# Patient Record
Sex: Male | Born: 1955 | Race: Black or African American | Hispanic: No | Marital: Married | Smoking: Former smoker
Health system: Southern US, Community
[De-identification: ages and names within clinical notes are randomized; demographics above are authoritative.]

## PROBLEM LIST (undated history)

## (undated) DIAGNOSIS — J309 Allergic rhinitis, unspecified: Secondary | ICD-10-CM

## (undated) DIAGNOSIS — E785 Hyperlipidemia, unspecified: Secondary | ICD-10-CM

## (undated) DIAGNOSIS — I10 Essential (primary) hypertension: Secondary | ICD-10-CM

## (undated) DIAGNOSIS — E119 Type 2 diabetes mellitus without complications: Secondary | ICD-10-CM

## (undated) HISTORY — DX: Hyperlipidemia, unspecified: E78.5

## (undated) HISTORY — DX: Morbid (severe) obesity due to excess calories: E66.01

## (undated) HISTORY — PX: NO PAST SURGERIES: SHX2092

## (undated) HISTORY — DX: Allergic rhinitis, unspecified: J30.9

## (undated) HISTORY — DX: Essential (primary) hypertension: I10

## (undated) HISTORY — DX: Type 2 diabetes mellitus without complications: E11.9

---

## 1998-04-12 ENCOUNTER — Emergency Department (HOSPITAL_COMMUNITY): Admission: EM | Admit: 1998-04-12 | Discharge: 1998-04-12 | Payer: Self-pay | Admitting: Emergency Medicine

## 2010-03-02 ENCOUNTER — Emergency Department (HOSPITAL_COMMUNITY): Admission: EM | Admit: 2010-03-02 | Discharge: 2010-03-02 | Payer: Self-pay | Admitting: Emergency Medicine

## 2010-03-02 LAB — CONVERTED CEMR LAB
BUN: 13 mg/dL
CO2: 29 meq/L
Glucose, Bld: 166 mg/dL
Platelets: 230 10*3/uL

## 2010-03-14 ENCOUNTER — Ambulatory Visit: Payer: Self-pay | Admitting: Internal Medicine

## 2010-03-14 DIAGNOSIS — I10 Essential (primary) hypertension: Secondary | ICD-10-CM

## 2010-03-14 DIAGNOSIS — J309 Allergic rhinitis, unspecified: Secondary | ICD-10-CM | POA: Insufficient documentation

## 2010-03-14 DIAGNOSIS — R519 Headache, unspecified: Secondary | ICD-10-CM | POA: Insufficient documentation

## 2010-03-14 DIAGNOSIS — R51 Headache: Secondary | ICD-10-CM

## 2010-04-11 ENCOUNTER — Ambulatory Visit: Payer: Self-pay | Admitting: Internal Medicine

## 2010-04-11 DIAGNOSIS — E119 Type 2 diabetes mellitus without complications: Secondary | ICD-10-CM

## 2010-04-11 DIAGNOSIS — IMO0002 Reserved for concepts with insufficient information to code with codable children: Secondary | ICD-10-CM | POA: Insufficient documentation

## 2010-04-11 LAB — CONVERTED CEMR LAB
CO2: 28 meq/L (ref 19–32)
Cholesterol: 243 mg/dL — ABNORMAL HIGH (ref 0–200)
Creatinine, Ser: 1 mg/dL (ref 0.4–1.5)
HDL: 44.7 mg/dL (ref >39.00)
Hgb A1c MFr Bld: 6.9 % — ABNORMAL HIGH (ref 4.6–6.5)
Sodium: 135 meq/L (ref 135–145)
TSH: 1.66 microintl units/mL (ref 0.35–5.50)
Total CHOL/HDL Ratio: 5
Triglycerides: 159 mg/dL — ABNORMAL HIGH (ref 0.0–149.0)
VLDL: 31.8 mg/dL (ref 0.0–40.0)

## 2010-05-02 ENCOUNTER — Telehealth: Payer: Self-pay | Admitting: Internal Medicine

## 2010-05-10 ENCOUNTER — Telehealth: Payer: Self-pay | Admitting: Internal Medicine

## 2010-05-10 ENCOUNTER — Encounter: Payer: Self-pay | Admitting: Internal Medicine

## 2010-05-24 ENCOUNTER — Ambulatory Visit: Payer: Self-pay | Admitting: Internal Medicine

## 2010-05-24 DIAGNOSIS — E782 Mixed hyperlipidemia: Secondary | ICD-10-CM

## 2010-05-30 ENCOUNTER — Telehealth: Payer: Self-pay | Admitting: Internal Medicine

## 2010-07-12 ENCOUNTER — Ambulatory Visit: Payer: Self-pay | Admitting: Internal Medicine

## 2010-08-16 ENCOUNTER — Ambulatory Visit: Payer: Self-pay | Admitting: Internal Medicine

## 2010-09-06 ENCOUNTER — Ambulatory Visit: Payer: Self-pay | Admitting: Internal Medicine

## 2010-10-04 ENCOUNTER — Ambulatory Visit
Admission: RE | Admit: 2010-10-04 | Discharge: 2010-10-04 | Payer: Self-pay | Source: Home / Self Care | Attending: Internal Medicine | Admitting: Internal Medicine

## 2010-10-18 NOTE — Assessment & Plan Note (Signed)
Summary: 6 WK ROV /NWS  #   Vital Signs:  Patient profile:   55 year old male Height:      70 inches (177.80 cm) Weight:      306 pounds (139.09 kg) O2 Sat:      97 % on Room air Temp:     98.6 degrees F (37.00 degrees C) oral Pulse rate:   68 / minute BP sitting:   158 / 84  (left arm) Cuff size:   large  Vitals Entered By: Orlan Leavens RMA (May 24, 2010 1:29 PM)  O2 Flow:  Room air CC: 6 week follow-up Is Patient Diabetic? Yes Did you bring your meter with you today? No Pain Assessment Patient in pain? no      Comments Pt states he has'nt started Lipitor yet   Primary Care Provider:  Newt Lukes MD  CC:  6 week follow-up.  History of Present Illness: here for f/u -  1) HTN - new dx 03/02/10 - begun on hctz for same during ER visit for HA and added ACEI 02/2010 - then added amlodipine end 03/2010- reports 100% compliance with ongoing medical treatment and no changes in medication dose or frequency. denies adverse side effects related to current therapy. mild chronic edema ankles, no CP or other HA - no vision changes  2) obesity - hx insulin resistance "but not DM" per pt - denies weight gain but c/o difficulty losing weight despite physcial activty and diet changes - congrats on 4# down since last OV, 9# since est care  3) HA - seen in ER 6/15 for same - symptoms improved since starting meds for BP control - no HA today - no weakness or vison changes - symptoms last <3 hours per episode, no aura or migraine hx - occ use tylenol for same  4) seasonal allg - occ use OTC meds for lfares - no current or active symptoms - no sneezing, runny nose or sinus pressure - no nasal dc or cough   5) dyslipidemia - has not begun on meds rx 03/2010 due to concerns over poss SE - comitted to  low fat diet and cont weight loss  Current Medications (verified): 1)  Lisinopril-Hydrochlorothiazide 20-25 Mg Tabs (Lisinopril-Hydrochlorothiazide) .Marland Kitchen.. 1 By Mouth Once Daily 2)   Metformin Hcl 500 Mg Xr24h-Tab (Metformin Hcl) .Marland Kitchen.. 1 By Mouth Every Morning 3)  Amlodipine Besylate 5 Mg Tabs (Amlodipine Besylate) .Marland Kitchen.. 1 By Mouth Once Daily 4)  Lipitor 20 Mg Tabs (Atorvastatin Calcium) .Marland Kitchen.. 1 By Mouth At Bedtime  Allergies (verified): 1)  ! Penicillin  Past History:  Past Medical History: Allergic rhinitis Hypertension obesity with insulin resistance Hyperlipidemia  Review of Systems  The patient denies fever, chest pain, headaches, and abdominal pain.    Physical Exam  General:  obese, alert, well-developed, well-nourished, and cooperative to examination.    Lungs:  normal respiratory effort, no intercostal retractions or use of accessory muscles; normal breath sounds bilaterally - no crackles and no wheezes.    Heart:  normal rate, regular rhythm, no murmur, and no rub. BLE with trace pedal edema.  Psych:  reserved, quiet - Oriented X3, memory intact for recent and remote, normally interactive, good eye contact, not anxious appearing, not depressed appearing, and not agitated.      Impression & Recommendations:  Problem # 1:  HYPERTENSION (ICD-401.9)  His updated medication list for this problem includes:    Lisinopril-hydrochlorothiazide 20-25 Mg Tabs (Lisinopril-hydrochlorothiazide) .Marland Kitchen... 1 by mouth  once daily    Amlodipine Besylate 10 Mg Tabs (Amlodipine besylate) .Marland Kitchen... 1 by mouth once daily  improving but still uncontrolled -  titrate amlodipine now - new erx done   BP today: 158/84 Prior BP: 188/92 (04/11/2010) Prior BP: 190/101 (03/14/2010)  Labs Reviewed: K+: 4.3 (04/11/2010) Creat: : 1.0 (04/11/2010)   Chol: 243 (04/11/2010)   HDL: 44.70 (04/11/2010)   TG: 159.0 (04/11/2010)  Orders: Prescription Created Electronically [id number])  Problem # 2:  HYPERLIPIDEMIA (ICD-272.4) does not wish to start med tx for same -  educated on need to control ASD risk factors given other comorbidities cont to work on diet and weioght loss - recheck late  09/2010 - revisit med rx if still uncontrolled The following medications were removed from the medication list:    Lipitor 20 Mg Tabs (Atorvastatin calcium) .Marland Kitchen... 1 by mouth at bedtime    HDL:44.70 (04/11/2010)  Chol:243 (04/11/2010)  Trig:159.0 (04/11/2010)  Problem # 3:  OBESITY, MORBID (ICD-278.01) congrats on cont weight loss - encouragement and education provided  Ht: 70 (03/14/2010)   Wt: 315.8 (03/14/2010)   BMI: 45.48 (03/14/2010)  Ht: 70 (04/11/2010)   Wt: 310.12 (04/11/2010)   BMI: 45.48 (03/14/2010)  Ht: 70 (05/24/2010)   Wt: 306 (05/24/2010)   BMI: 45.48 (03/14/2010)  Problem # 4:  DIABETES MELLITUS, TYPE II (ICD-250.00) insulin resistance - cont metformin tx and recheck a1c q 61mo His updated medication list for this problem includes:    Lisinopril-hydrochlorothiazide 20-25 Mg Tabs (Lisinopril-hydrochlorothiazide) .Marland Kitchen... 1 by mouth once daily    Metformin Hcl 500 Mg Xr24h-tab (Metformin hcl) .Marland Kitchen... 1 by mouth every morning  Labs Reviewed: Creat: 1.0 (04/11/2010)    Reviewed HgBA1c results: 6.9 (04/11/2010)  Complete Medication List: 1)  Lisinopril-hydrochlorothiazide 20-25 Mg Tabs (Lisinopril-hydrochlorothiazide) .Marland Kitchen.. 1 by mouth once daily 2)  Metformin Hcl 500 Mg Xr24h-tab (Metformin hcl) .Marland Kitchen.. 1 by mouth every morning 3)  Amlodipine Besylate 10 Mg Tabs (Amlodipine besylate) .Marland Kitchen.. 1 by mouth once daily  Patient Instructions: 1)  it was good to see you today. 2)  increase amlodipine dose and continue the combination blood pressure pill for your high BP readings- 3)  continue the metformin for the insulin resistance but hold on lipitor for now- 4)  your amlodipine prescription has been electronically submitted to your pharmacy. Please take as directed. Contact our office if you believe you're having problems with the medication(s).  5)  good job on the weight loss so far - keep it up! 6)  Please schedule a follow-up appointment in 6-8 weeks, sooner if problems.    Prescriptions: AMLODIPINE BESYLATE 10 MG TABS (AMLODIPINE BESYLATE) 1 by mouth once daily  #30 x 3   Entered and Authorized by:   Newt Lukes MD   Signed by:   Newt Lukes MD on 05/24/2010   Method used:   [id number]       [address]       [place], [place]  [address]       Ph: [phone] or [phone]       Fax: [phone]   RxID:   [id number]

## 2010-10-18 NOTE — Progress Notes (Signed)
Summary: lisinopril  Phone Note Refill Request Message from:  Fax from Pharmacy on May 02, 2010 10:18 AM  Refills Requested: Medication #1:  LISINOPRIL-HYDROCHLOROTHIAZIDE 20-25 MG TABS 1 by mouth once daily Pt is requesting 90 day supply on lisinopril   Method Requested: Electronic Initial call taken by: Orlan Leavens RMA,  May 02, 2010 10:19 AM    Prescriptions: LISINOPRIL-HYDROCHLOROTHIAZIDE 20-25 MG TABS (LISINOPRIL-HYDROCHLOROTHIAZIDE) 1 by mouth once daily  #90 x 0   Entered by:   Orlan Leavens RMA   Authorized by:   Newt Lukes MD   Signed by:   Orlan Leavens RMA on 05/02/2010   Method used:   [facility]        [facility] [id number]       [address]       [place], [place]  [address]       Ph: [phone] or [phone]       Fax: [phone]   RxID:   [id number]   Appended Document: lisinopril & metformin    Clinical Lists Changes  Medications: Rx of METFORMIN HCL 500 MG XR24H-TAB (METFORMIN HCL) 1 by mouth every morning;  #90 x 0;  Signed;  Entered by: Orlan Leavens RMA;  Authorized by: Newt Lukes MD;  Method used: [id number] [address], [place], [place]  [address], Ph: [phone] or [phone], Fax: [phone]    Prescriptions: METFORMIN HCL 500 MG XR24H-TAB (METFORMIN HCL) 1 by mouth every morning  #90 x 0   Entered by:   Orlan Leavens RMA   Authorized by:   Newt Lukes MD   Signed by:   Orlan Leavens RMA on 05/02/2010   Method used:   [facility]        [facility] [id number]       [address]       [place], [place]  [address]       Ph: [phone] or [phone]       Fax: [phone]   RxID:   [id number]

## 2010-10-18 NOTE — Assessment & Plan Note (Signed)
Summary: 2- 4 wk f/u #/cd   Vital Signs:  Patient profile:   55 year old male Height:      70 inches (177.80 cm) Weight:      300.0 pounds (136.36 kg) O2 Sat:      97 % on Room air Temp:     97.6 degrees F (36.44 degrees C) oral Pulse rate:   70 / minute BP sitting:   150 / 88  (left arm) Cuff size:   large  Vitals Entered By: Orlan Leavens RMA (August 16, 2010 1:30 PM)  O2 Flow:  Room air CC: 4 week follow-up Is Patient Diabetic? Yes Did you bring your meter with you today? No Pain Assessment Patient in pain? no        Primary Care Jonnae Fonseca:  Newt Lukes MD  CC:  4 week follow-up.  History of Present Illness: here for f/u -  1) HTN - new dx 03/02/10 - begun on hctz for same during ER visit for HA and added ACEI 02/2010 - then added amlodipine end 03/2010, inc dose 05/2010- reports 100% compliance with ongoing medical treatment and no changes in medication dose or frequency. 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 mild chronic edema ankles, no CP or other HA - no vision changes - changed to combo tribenzor 07/12/10  2) obesity - hx insulin resistance "but not DM" per pt - denies weight gain but c/o difficulty losing weight despite physcial activty and diet changes - congrats on 1# down since last OV, 10# since est care  3) HA - seen in ER 03/02/10 for same - symptoms improved since starting meds for BP control - no HA today - no weakness or vison changes - symptoms last <3 hours per episode, no aura or migraine hx - occ use tylenol for same  4) seasonal allg - occ use OTC meds for lfares - no current or active symptoms - no sneezing, runny nose or sinus pressure - no nasal dc or cough   5) dyslipidemia - has not begun on meds rx 03/2010 due to concerns over poss SE - comitted to  low fat diet and cont weight loss  6) DM2 - started metformin low dose for same 04/2010 - not monitoring cbgs but reports  compliance with ongoing medical treatment and no changes in medication dose or frequency. denies adverse side effects related to current therapy.   Current Medications (verified): 1)  Lisinopril-Hydrochlorothiazide 20-25 Mg Tabs (Lisinopril-Hydrochlorothiazide) .... Stop For Now 2)  Metformin Hcl 500 Mg Xr24h-Tab (Metformin Hcl) .Marland Kitchen.. 1 By Mouth Every Morning 3)  Tribenzor 40-5-25 Mg Tabs (Olmesartan-Amlodipine-Hctz) .Marland Kitchen.. 1 By Mouth Once Daily  Allergies (verified): 1)  ! Penicillin  Past History:  Past Medical History: Allergic rhinitis Hypertension obesity with insulin resistance Hyperlipidemia - declines statin or other rx tx  Review of Systems  The patient denies fever, vision loss, chest pain, and syncope.    Physical Exam  General:  obese, alert, well-developed, well-nourished, and cooperative to examination.    Lungs:  normal respiratory effort, no intercostal retractions or use of accessory muscles; normal breath sounds bilaterally - no crackles and no wheezes.    Heart:  normal rate, regular rhythm, no murmur, and no rub. BLE with trace pedal edema.    Impression & Recommendations:  Problem # 1:  HYPERTENSION (ICD-401.9)  The following medications were removed from the medication list:    Lisinopril-hydrochlorothiazide 20-25  Mg Tabs (Lisinopril-hydrochlorothiazide) ..... Stop for now His updated medication list for this problem includes:    Tribenzor 40-5-25 Mg Tabs (Olmesartan-amlodipine-hctz) .Marland Kitchen... 1 by mouth once daily    Tekturna 300 Mg Tabs (Aliskiren fumarate) .Marland Kitchen... 1 by mouth once daily  still uncontrolled -  felt inc sexual dysfx on max dose amlodipine - resumed 5mg  dose 07/12/10 cont tribenzor for med conveince dosing and add tekturna - samples given recheck 2 weeks for BP recheck and Cr/K check  BP today: 150/88 Prior BP: 152/82 (07/12/2010) Prior BP: 158/84 (05/24/2010) Prior BP: 188/92 (04/11/2010) Prior BP: 190/101 (03/14/2010)  Labs  Reviewed: K+: 4.3 (04/11/2010) Creat: : 1.0 (04/11/2010)   Chol: 243 (04/11/2010)   HDL: 44.70 (04/11/2010)   TG: 159.0 (04/11/2010)  Orders: Prescription Created Electronically [id number])  Complete Medication List: 1)  Metformin Hcl 500 Mg Xr24h-tab (Metformin hcl) .Marland Kitchen.. 1 by mouth every morning 2)  Tribenzor 40-5-25 Mg Tabs (Olmesartan-amlodipine-hctz) .Marland Kitchen.. 1 by mouth once daily 3)  Tekturna 300 Mg Tabs (Aliskiren fumarate) .Marland Kitchen.. 1 by mouth once daily  Patient Instructions: 1)  it was good to see you today. 2)  continue Tribenzor 40/5/25daily 3)  also start tekturna 300mg  daily - 2 week samples given today 4)  also your prescription has been electronically submitted to your pharmacy. Please take as directed. Contact our office if you believe you're having problems with the medication(s).  5)  good job on the weight loss so far - keep it up! 6)  Please schedule a follow-up appointment in 2-3 weeks to recheck blood pressure and medications, sooner if problems.  7)  will plan to recheck cholesterol late 09/2009 and review need for Lipitor treatment -  Prescriptions: TEKTURNA 300 MG TABS (ALISKIREN FUMARATE) 1 by mouth once daily  #30 x 3   Entered and Authorized by:   Newt Lukes MD   Signed by:   Newt Lukes MD on 08/16/2010   Method used:   [facility]        [facility] [id number]       [address]       [place], [place]  [address]       Ph: [phone] or [phone]       Fax: [phone]   RxID:   [id number]    Orders Added: 1)  Est. Patient Level IV [[address]] 2)  Prescription Created Electronically [phone]

## 2010-10-18 NOTE — Progress Notes (Signed)
Summary: pharmacy fax - start lipid tx  Phone Note From Pharmacy   Summary of Call: fax note requesting lipid lowering therapy for this pt - FLP reviewed - will start lipitor 20mg  qhs now - new erx done -  Initial call taken by: Newt Lukes MD,  May 10, 2010 2:18 PM    New/Updated Medications: LIPITOR 20 MG TABS (ATORVASTATIN CALCIUM) 1 by mouth at bedtime Prescriptions: LIPITOR 20 MG TABS (ATORVASTATIN CALCIUM) 1 by mouth at bedtime  #30 x 3   Entered and Authorized by:   Newt Lukes MD   Signed by:   Newt Lukes MD on 05/10/2010   Method used:   [facility] to        [facility] [id number]       [address]       [place], [place]  [address]       Ph: [phone] or [phone]       Fax: [phone]   RxID:   [id number]

## 2010-10-18 NOTE — Assessment & Plan Note (Signed)
Summary: new [facility]bs pt--post hosp-disch'd-03/02/10/dx:hypertension-# pkg/...   Vital Signs:  Patient profile:   55 year old male Height:      70 inches (177.80 cm) Weight:      315.8 pounds (143.55 kg) BMI:     45.48 O2 Sat:      97 % on Room air Temp:     98.6 degrees F (37.00 degrees C) oral Pulse rate:   77 / minute BP sitting:   190 / 101  (left arm) Cuff size:   large  Vitals Entered By: Orlan Leavens (March 14, 2010 2:08 PM)  O2 Flow:  Room air CC: New patient/ ER f/u Hypertension Is Patient Diabetic? No Pain Assessment Patient in pain? no        Primary Care Provider:  Newt Lukes MD  CC:  New patient/ ER f/u Hypertension.  History of Present Illness: new pt to me and our practice, here to est care  1) HTN - new dx 6/15 - begun on meds for same during ER visit for HA- reports compliance with ongoing medical treatment and no changes in medication dose or frequency. denies adverse side effects related to current therapy. mild edema ankles, no CP or other HA - no vision changes  2) obesity - hx insulin resistance "but not DM" per pt - based [facility] - denies weight gain but c/o inablity to lose weight despite phsycial activty and diet changes  3) HA - seen in ER 6/15 for same - symptoms improved since starting meds for BP controll - no HA today - no weakness or vison changes - symptoms last <3 hours per episode, no aura or migraine hx - occ use tylenol for same  4) seasonal allg - occ use OTC meds for lfares - no current or active symptoms - no sneezing, runny nose or sinus pressure - no nasal dc or cough   Preventive Screening-Counseling & Management  Alcohol-Tobacco     Alcohol drinks/day: <1     Smoking Status: quit     Year Quit: 1985     Tobacco Counseling: not to resume use of tobacco products  Caffeine-Diet-Exercise     Diet Counseling: to improve diet; diet is suboptimal     Does Patient Exercise: yes     Exercise Counseling: to  improve exercise regimen     Depression Counseling: not indicated; screening negative for depression  Safety-Violence-Falls     Seat Belt Use: yes     Seat Belt Counseling: not indicated; patient wears seat belts     Helmet Use: n/a     Helmet Counseling: not applicable     Firearms in the Home: no firearms in the home     Firearm Counseling: not applicable     Violence Counseling: not indicated; no violence risk noted     Fall Risk Counseling: not indicated; no significant falls noted  Clinical Review Panels:  CBC   WBC:  8.6 (03/02/2010)   Hgb:  17.0 (03/02/2010)   Hct:  45.7 (03/02/2010)   Platelets:  230 (03/02/2010)  Complete Metabolic Panel   Glucose:  166 (03/02/2010)   Sodium:  139 (03/02/2010)   Potassium:  4.3 (03/02/2010)   Chloride:  105 (03/02/2010)   CO2:  29 (03/02/2010)   BUN:  13 (03/02/2010)   Creatinine:  0.8 (03/02/2010)   -  Date:  03/02/2010    BG Random: 166    BUN: 13    Creatinine: 0.8  Sodium: 139    Potassium: 4.3    Chloride: 105    CO2 Total: 29    WBC: 8.6    HGB: 17.0    HCT: 45.7    PLT: 230  Current Medications (verified): 1)  Lisinopril 5 Mg Tabs (Lisinopril) .... Take 1 By Mouth Once Daily 2)  Hydrochlorothiazide 25 Mg Tabs (Hydrochlorothiazide) .... Take 1 By Mouth Once Daily  Allergies (verified): 1)  ! 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 I have reviewed all other systems and they were negative.   Physical Exam  General:  obese, alert, well-developed, well-nourished, and cooperative to examination.    Eyes:  vision grossly intact; pupils equal, round and reactive to light.  conjunctiva and lids normal.    Ears:  normal pinnae bilaterally, without erythema, swelling, or tenderness to palpation. TMs clear, without effusion, or cerumen impaction. Hearing grossly normal bilaterally  Mouth:  teeth and gums in good repair; mucous membranes moist, without lesions or ulcers. oropharynx clear without exudate, no erythema.  Neck:  thick, supple, full ROM, no masses, no thyromegaly; no thyroid nodules or tenderness. no JVD or carotid bruits.   Lungs:  normal respiratory effort, no intercostal retractions or use of accessory muscles; normal breath sounds bilaterally - no crackles and no wheezes.    Heart:  normal rate, regular rhythm, no murmur, and no rub. BLE with trace pedal edema. normal DP pulses and normal cap refill in all 4 extremities    Abdomen:  soft, non-tender, normal bowel sounds, no distention; no masses and no appreciable hepatomegaly or splenomegaly.   Msk:  No gross deformity; no scoliosis noted of thoracic or lumbar spine.   Neurologic:  alert & oriented X3 and cranial nerves II-XII symetrically intact.  strength normal in all extremities, sensation intact to light touch, and gait normal. speech fluent without dysarthria or aphasia; follows commands with good comprehension.  Skin:  no rashes, vesicles, ulcers, or erythema. No nodules or irregularity to palpation.  Psych:  reserved, quiet - Oriented X3, memory intact for recent and remote, normally interactive, good eye contact, not anxious appearing, not depressed appearing, and not agitated.      Impression & Recommendations:  Problem # 1:  HYPERTENSION (ICD-401.9)  increase med tx and combine as combo pill for ease -    expect will need 3rd agent to control - advised pt of same request close f/u next 4-6 weeks ER records and labs 6/15 reviewed -HA symptoms improved His updated medication list for this problem includes:    Lisinopril-hydrochlorothiazide 20-25 Mg Tabs (Lisinopril-hydrochlorothiazide) .Marland Kitchen... 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)  suspect underlying DM - will send for records from [facility] start metformin tx for same - educated on need for diet and  weight control to optimize health -   Orders: Prescription Created Electronically [id number])  Problem # 3:  OBESITY, MORBID (ICD-278.01)  Ht: 70 (03/14/2010)   Wt: 315.8 (03/14/2010)   BMI: 45.48 (03/14/2010) Time spent with patient 45 minutes, more than 50% of this time was spent counseling patient on weight managment, recent ER visit and test results and explaination of meds for BP and metabolic syndrome  Problem # 4:  HEADACHE (ICD-784.0) Assessment: Improved er 6/15 note reviewed -  neuro exam benign  Problem # 5:  ALLERGIC RHINITIS (ICD-477.9)  Discussed use of allergy medications and environmental measures.   Complete Medication List: 1)  Lisinopril-hydrochlorothiazide 20-25 Mg Tabs (Lisinopril-hydrochlorothiazide) .Marland Kitchen.. 1 by mouth once daily 2)  Metformin Hcl 500 Mg Xr24h-tab (Metformin hcl) .Marland Kitchen.. 1 by mouth every morning  Patient Instructions: 1)  it was good to see you today. 2)  change to combination blood pressure pill - and maximize doses of each medication in this one pill 3)  also start metformin for the insulin resistance as discussed- 4)  your prescriptions have been electronically submitted to your pharmacy. Please take as directed. Contact our office if you believe you're having problems with the medication(s).  5)  will send for records  from [facility] to review - 6)  Please schedule a follow-up appointment in  4-6 weeks, sooner if problems.  Prescriptions: METFORMIN HCL 500 MG XR24H-TAB (METFORMIN HCL) 1 by mouth every morning  #30 x 3   Entered and Authorized by:   Newt Lukes MD   Signed by:   Newt Lukes MD on 03/14/2010   Method used:   [id number]       [address]       [place], [place]  [address]       Ph: [phone] or [phone]       Fax: [phone]   RxID:   [id number] LISINOPRIL-HYDROCHLOROTHIAZIDE 20-25 MG TABS (LISINOPRIL-HYDROCHLOROTHIAZIDE) 1 by mouth once daily  #30 x 3   Entered and Authorized by:   Newt Lukes MD   Signed by:   Newt Lukes MD on 03/14/2010   Method used:   [facility]        [id number]       [address]       [place], [place]  [address]       Ph: [phone] or [phone]       Fax: [phone]   RxID:   [id number]    CXR  Procedure date:  03/02/2010  Findings:      chest 2 view Impression: No active disease the left ventricular contour is somewhat prominent

## 2010-10-18 NOTE — Progress Notes (Signed)
Summary: amlodipine  Phone Note Refill Request Message from:  Fax from Pharmacy on May 30, 2010 1:37 PM  Refills Requested: Medication #1:  Amlodipine 5 mg take 1 po qd Initial call taken by: Orlan Leavens RMA,  May 30, 2010 1:37 PM  Follow-up for Phone Call        Faxed back paper req denied md increase mg to 10mg  on 05/24/10. MD sent new rx in with 3 addtional refills Follow-up by: Orlan Leavens RMA,  May 30, 2010 1:38 PM

## 2010-10-18 NOTE — Medication Information (Signed)
Summary: Lipid[facility]Lowering [facility]  [facility]   Imported By: Sherian Rein 05/12/2010 08:15:28  _____________________________________________________________________  External Attachment:    Type:   Image     Comment:   External Document

## 2010-10-18 NOTE — Assessment & Plan Note (Signed)
Summary: 4-6 WK FU---STC   Vital Signs:  Patient profile:   55 year old male Height:      70 inches (177.80 cm) Weight:      310.12 pounds (140.96 kg) O2 Sat:      96 % on Room air Temp:     98.6 degrees F (37.00 degrees C) oral Pulse rate:   80 / minute BP sitting:   188 / 92  (left arm) Cuff size:   large  Vitals Entered By: Orlan Leavens RMA (April 11, 2010 1:26 PM)  O2 Flow:  Room air CC: 4 week follow-up Is Patient Diabetic? No Pain Assessment Patient in pain? no        Primary Care Provider:  Newt Lukes MD  CC:  4 week follow-up.  History of Present Illness: here for f/u -  1) HTN - new dx 03/02/10 - begun on hctz for same during ER visit for HA and added ACEI 1 mo ago- reports 100% compliance with ongoing medical treatment and no changes in medication dose or frequency. denies adverse side effects related to current therapy. mild chronic edema ankles, no CP or other HA - no vision changes  2) obesity - hx insulin resistance "but not DM" per pt - based on prior eval at [facility] - denies weight gain but c/o difficulty losing weight despite phsycial activty and diet changes -  - congrats on 5# down since last OV  3) HA - seen in ER 6/15 for same - symptoms improved since starting meds for BP control - no HA today - no weakness or vison changes - symptoms last <3 hours per episode, no aura or migraine hx - occ use tylenol for same  4) seasonal allg - occ use OTC meds for lfares - no current or active symptoms - no sneezing, runny nose or sinus pressure - no nasal dc or cough   Clinical Review Panels:  CBC   WBC:  8.6 (03/02/2010)   Hgb:  17.0 (03/02/2010)   Hct:  45.7 (03/02/2010)   Platelets:  230 (03/02/2010)  Complete Metabolic Panel   Glucose:  166 (03/02/2010)   Sodium:  139 (03/02/2010)   Potassium:  4.3 (03/02/2010)   Chloride:  105 (03/02/2010)   CO2:  29 (03/02/2010)   BUN:  13 (03/02/2010)   Creatinine:  0.8 (03/02/2010)   Current Medications  (verified): 1)  Lisinopril-Hydrochlorothiazide 20-25 Mg Tabs (Lisinopril-Hydrochlorothiazide) .Marland Kitchen.. 1 By Mouth Once Daily 2)  Metformin Hcl 500 Mg Xr24h-Tab (Metformin Hcl) .Marland Kitchen.. 1 By Mouth Every Morning  Allergies (verified): 1)  ! 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    Physical Exam  General:  obese, alert, well-developed, well-nourished, and cooperative to examination.    Lungs:  normal respiratory effort, no intercostal retractions or use of accessory muscles; normal breath sounds bilaterally - no crackles and no wheezes.    Heart:  normal rate, regular rhythm, no murmur, and no rub. BLE with trace pedal edema.  Neurologic:  alert & oriented X3 and cranial nerves II-XII symetrically intact.  strength normal in all extremities, sensation intact to light touch, and gait normal. speech fluent without dysarthria or aphasia; follows commands  with good comprehension.  Psych:  reserved, quiet - Oriented X3, memory intact for recent and remote, normally interactive, good eye contact, not anxious appearing, not depressed appearing, and not agitated.      Impression & Recommendations:  Problem # 1:  HYPERTENSION (ICD-401.9)  still uncontrolled -  add 3rd agent now -  Cr/K prev normal 6/15 ER visit but recheck now (since starting ACEI 1 mo ago) - consider eval for secondary causes as pt strongly denies prior hx same-  but no other abn or "red flags" on hx His updated medication list for this problem includes:    Lisinopril-hydrochlorothiazide 20-25 Mg Tabs (Lisinopril-hydrochlorothiazide) .Marland Kitchen... 1 by mouth once daily    Amlodipine  Besylate 5 Mg Tabs (Amlodipine besylate) .Marland Kitchen... 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)  Orders: TLB-TSH (Thyroid Stimulating Hormone) (84443-TSH) TLB-BMP (Basic Metabolic Panel-BMET) (80048-METABOL) TLB-A1C / Hgb A1C (Glycohemoglobin) (83036-A1C) TLB-Lipid Panel (80061-LIPID)  suspect underlying DM - no records from urg care started metformin tx for same  4 weeks ago- educated again on need for diet and weight control to optimize health -   Problem # 3:  OBESITY, MORBID (ICD-278.01)  Orders: TLB-TSH (Thyroid Stimulating Hormone) (84443-TSH) TLB-BMP (Basic Metabolic Panel-BMET) (80048-METABOL) TLB-A1C / Hgb A1C (Glycohemoglobin) (83036-A1C) TLB-Lipid Panel (80061-LIPID)  Ht: 70 (03/14/2010)   Wt: 315.8 (03/14/2010)   BMI: 45.48 (03/14/2010)  Ht: 70 (04/11/2010)   Wt: 310.12 (04/11/2010)   BMI: 45.48 (03/14/2010)  Complete Medication List: 1)  Lisinopril-hydrochlorothiazide 20-25 Mg Tabs (Lisinopril-hydrochlorothiazide) .Marland Kitchen.. 1 by mouth once daily 2)  Metformin Hcl 500 Mg Xr24h-tab (Metformin hcl) .Marland Kitchen.. 1 by mouth every morning 3)  Amlodipine Besylate 5 Mg Tabs (Amlodipine besylate) .Marland Kitchen.. 1 by mouth once daily  Patient Instructions: 1)  it was good to see you today. 2)  test(s) ordered today - your results will be posted on the phone tree for review in 48-72 hours from the time of test completion; call [phone] and enter your 9 digit MRN (listed above on this page, just below your name); if any changes need to be made or there are abnormal results, you will be contacted directly.  3)  add  amlodipine and continue the combination blood pressure pill for your high BP readings- 4)  continue the metformin for the insulin resistance as discussed- 5)  your  amlodipine prescription has been electronically submitted to your pharmacy. Please take as directed. Contact our office if you believe you're having problems with the medication(s).  6)  good job on the weight loss so far - keep it up! 7)  Please schedule a follow-up appointment in  6 weeks, sooner if problems.  Prescriptions: AMLODIPINE BESYLATE 5 MG TABS (AMLODIPINE BESYLATE) 1 by mouth once daily  #30 x 3   Entered and Authorized by:   Newt Lukes MD   Signed by:   Newt Lukes MD on 04/11/2010   Method used:   [facility]  [address] [id number]       [address]       [place], [place]  [address]  Ph: [phone] or [phone]       Fax: [phone]   RxID:   [id number]

## 2010-10-18 NOTE — Assessment & Plan Note (Signed)
Summary: 6-8 WK ROV /NWS #   Vital Signs:  Patient profile:   55 year old male Height:      70 inches (177.80 cm) Weight:      301.0 pounds (136.82 kg) O2 Sat:      97 % on Room air Temp:     98.3 degrees F (36.83 degrees C) oral Pulse rate:   73 / minute BP sitting:   152 / 82  (left arm) Cuff size:   large  Vitals Entered By: Orlan Leavens RMA (July 12, 2010 1:29 PM)  O2 Flow:  Room air CC: 6 week follow-up Is Patient Diabetic? Yes Did you bring your meter with you today? No Pain Assessment Patient in pain? no        Primary Care Provider:  Newt Lukes MD  CC:  6 week follow-up.  History of Present Illness: here for f/u -  1) HTN - new dx 03/02/10 - begun on hctz for same during ER visit for HA and added ACEI 02/2010 - then added amlodipine end 03/2010, inc dose 05/2010- reports 100% compliance with ongoing medical treatment and no changes in medication dose or frequency. ?inc sexual dysfx on inc dose amlodipine but denies other adverse side effects related to current therapy. mild chronic edema ankles, no CP or other HA - no vision changes  2) obesity - hx insulin resistance "but not DM" per pt - denies weight gain but c/o difficulty losing weight despite physcial activty and diet changes - congrats on 4# down since last OV, 9# since est care  3) HA - seen in ER 6/15 for same - symptoms improved since starting meds for BP control - no HA today - no weakness or vison changes - symptoms last <3 hours per episode, no aura or migraine hx - occ use tylenol for same  4) seasonal allg - occ use OTC meds for lfares - no current or active symptoms - no sneezing, runny nose or sinus pressure - no nasal dc or cough   5) dyslipidemia - has not begun on meds rx 03/2010 due to concerns over poss SE - comitted to  low fat diet and cont weight loss  6) DM2 - started metformin low dose for same 04/2010 - not monitoring cbgs but reports compliance with ongoing medical treatment and no  changes in medication dose or frequency. denies adverse side effects related to current therapy.   Current Medications (verified): 1)  Lisinopril-Hydrochlorothiazide 20-25 Mg Tabs (Lisinopril-Hydrochlorothiazide) .Marland Kitchen.. 1 By Mouth Once Daily 2)  Metformin Hcl 500 Mg Xr24h-Tab (Metformin Hcl) .Marland Kitchen.. 1 By Mouth Every Morning 3)  Amlodipine Besylate 10 Mg Tabs (Amlodipine Besylate) .Marland Kitchen.. 1 By Mouth Once Daily  Allergies (verified): 1)  ! Penicillin  Past History:  Past Medical History: Allergic rhinitis Hypertension obesity with insulin resistance Hyperlipidemia  Review of Systems  The patient denies anorexia, fever, weight gain, chest pain, and headaches.    Physical Exam  General:  obese, alert, well-developed, well-nourished, and cooperative to examination.    Lungs:  normal respiratory effort, no intercostal retractions or use of accessory muscles; normal breath sounds bilaterally - no crackles and no wheezes.    Heart:  normal rate, regular rhythm, no murmur, and no rub. BLE with trace pedal edema.  Neurologic:  alert & oriented X3 and cranial nerves II-XII symetrically intact.  strength normal in all extremities, sensation intact to light touch, and gait normal. speech fluent without dysarthria or aphasia; follows commands with  good comprehension.    Impression & Recommendations:  Problem # 1:  HYPERTENSION (ICD-401.9)  His updated medication list for this problem includes:    Lisinopril-hydrochlorothiazide 20-25 Mg Tabs (Lisinopril-hydrochlorothiazide) ..... Stop for now    Tribenzor 40-5-25 Mg Tabs (Olmesartan-amlodipine-hctz) .Marland Kitchen... 1 by mouth once daily  still uncontrolled -  feels inc sexual dysfx on inc dose amlodipine - return to 5mg  dose change ACEI to ARB and cont diuretic change meds to one pill - tribenzor -  recheck 2 weeks for BP recheck   BP today: 152/82 Prior BP: 158/84 (05/24/2010) Prior BP: 188/92 (04/11/2010) Prior BP: 190/101 (03/14/2010)  Labs  Reviewed: K+: 4.3 (04/11/2010) Creat: : 1.0 (04/11/2010)   Chol: 243 (04/11/2010)   HDL: 44.70 (04/11/2010)   TG: 159.0 (04/11/2010)  Problem # 2:  HYPERLIPIDEMIA (ICD-272.4)  does not wish to start med tx for same -  educated on need to control ASD risk factors given other comorbidities cont to work on diet and weioght loss - recheck late 09/2010 - revisit med rx if still uncontrolled  The following medications were removed from the medication list:    Lipitor 20 Mg Tabs (Atorvastatin calcium) .Marland Kitchen... 1 by mouth at bedtime    HDL:44.70 (04/11/2010)  Chol:243 (04/11/2010)  Trig:159.0 (04/11/2010)  Problem # 3:  DIABETES MELLITUS, TYPE II (ICD-250.00)  His updated medication list for this problem includes:    Lisinopril-hydrochlorothiazide 20-25 Mg Tabs (Lisinopril-hydrochlorothiazide) ..... Stop for now    Metformin Hcl 500 Mg Xr24h-tab (Metformin hcl) .Marland Kitchen... 1 by mouth every morning    Tribenzor 40-5-25 Mg Tabs (Olmesartan-amlodipine-hctz) .Marland Kitchen... 1 by mouth once daily  insulin resistance - cont metformin tx and recheck a1c q 20mo  Labs Reviewed: Creat: 1.0 (04/11/2010)    Reviewed HgBA1c results: 6.9 (04/11/2010)  Orders: TLB-A1C / Hgb A1C (Glycohemoglobin) (83036-A1C)  Problem # 4:  OBESITY, MORBID (ICD-278.01)  congrats on cont weight loss - encouragement and education provided  Ht: 70 (03/14/2010)   Wt: 315.8 (03/14/2010)   BMI: 45.48 (03/14/2010)  Ht: 70 (04/11/2010)   Wt: 310.12 (04/11/2010)   BMI: 45.48 (03/14/2010)  Ht: 70 (05/24/2010)   Wt: 306 (05/24/2010)   BMI: 45.48 (03/14/2010)  Ht: 70 (07/12/2010)   Wt: 301.0 (07/12/2010)   BMI: 45.48 (03/14/2010)  Complete Medication List: 1)  Lisinopril-hydrochlorothiazide 20-25 Mg Tabs (Lisinopril-hydrochlorothiazide) .... Stop for now 2)  Metformin Hcl 500 Mg Xr24h-tab (Metformin hcl) .Marland Kitchen.. 1 by mouth every morning 3)  Tribenzor 40-5-25 Mg Tabs (Olmesartan-amlodipine-hctz) .Marland Kitchen.. 1 by mouth once daily  Patient  Instructions: 1)  it was good to see you today. 2)  stop lisinopril HCT for now 3)  stop amlodipine 10mg  tab 4)  start Tribenzor 40/5/25 - 3 weeks sam[ples given and your prescription has been electronically submitted to your pharmacy. Please take as directed. Contact our office if you believe you're having problems with the medication(s).  5)  good job on the weight loss so far - keep it up! 6)  test(s) ordered today - your results will be posted on the phone tree for review in 48-72 hours from the time of test completion; call [phone] and enter your 9 digit MRN (listed above on this page, just below your name); if any changes need to be made or there are abnormal results, you will be contacted directly.  7)  Please schedule a follow-up appointment in 2-4 weeks to recheck blood pressure and medications, sooner if problems.  8)  will plan to recheck cholesterol late 09/2009 and review  need for Lipitor treatment -  Prescriptions: TRIBENZOR 40-5-25 MG TABS (OLMESARTAN-AMLODIPINE-HCTZ) 1 by mouth once daily  #30 x 3   Entered and Authorized by:   Newt Lukes MD   Signed by:   Newt Lukes MD on 07/12/2010   Method used:   [facility]  [address] [id number]       [address]       [place], [place]  [address]       Ph: [phone] or [phone]       Fax: [phone]   RxID:   [id number]    Orders Added: 1)  Est. Patient Level IV [36644] 2)  TLB-A1C / Hgb A1C (Glycohemoglobin) [03474-Q5Z]

## 2010-10-20 NOTE — Assessment & Plan Note (Signed)
Summary: 2-3 wk rov /nws   Vital Signs:  Patient profile:   55 year old male Height:      70 inches (177.80 cm) Weight:      300.0 pounds (136.36 kg) O2 Sat:      97 % on Room air Temp:     98.7 degrees F (37.06 degrees C) oral Pulse rate:   66 / minute BP sitting:   148 / 90  (left arm) Cuff size:   large  Vitals Entered By: Orlan Leavens RMA (September 06, 2010 1:28 PM)  O2 Flow:  Room air CC: 2-4 week follow-up Is Patient Diabetic? Yes Did you bring your meter with you today? No Pain Assessment Patient in pain? no        Primary Care Provider:  Newt Lukes MD  CC:  2-4 week follow-up.  History of Present Illness: here for f/u -  1) HTN - dx 03/02/10 - begun on hctz  then added ACEI 02/2010 - then added amlodipine end 03/2010, inc dose 9/201 - changed to combo tribenzor 07/12/10 and added tekturna 08/16/2010 - reports 100% compliance with ongoing medical treatment and no changes in medication dose or frequency. denies adverse side effects related to current therapy. mild chronic edema ankles, no CP or other HA - no vision changes  2) obesity - hx insulin resistance "but not DM" per pt - denies weight gain but c/o difficulty losing weight despite physcial activty and diet changes - congrats on 1# down since last OV, 10# since est care  3) HA - seen in ER 03/02/10 for same - symptoms improved since starting meds for BP control - no HA today - no weakness or vison changes - symptoms last <3 hours per episode, no aura or migraine hx - occ use tylenol for same  4) seasonal allg - occ use OTC meds for lfares - no current or active symptoms - no sneezing, runny nose or sinus pressure - no nasal dc or cough   5) dyslipidemia - has not begun on meds rx 03/2010 due to concerns over poss SE - comitted to  low fat diet and cont weight loss  6) DM2 - started metformin low dose for same 04/2010 - not monitoring cbgs but reports compliance with ongoing medical treatment and no changes in  medication dose or frequency. denies adverse side effects related to current therapy.   Clinical Review Panels:  Diabetes Management   HgBA1C:  6.3 (07/12/2010)   Creatinine:  1.0 (04/11/2010)  CBC   WBC:  8.6 (03/02/2010)   Hgb:  17.0 (03/02/2010)   Hct:  45.7 (03/02/2010)   Platelets:  230 (03/02/2010)  Complete Metabolic Panel   Glucose:  118 (04/11/2010)   Sodium:  135 (04/11/2010)   Potassium:  4.3 (04/11/2010)   Chloride:  100 (04/11/2010)   CO2:  28 (04/11/2010)   BUN:  20 (04/11/2010)   Creatinine:  1.0 (04/11/2010)   Calcium:  9.0 (04/11/2010)   Current Medications (verified): 1)  Metformin Hcl 500 Mg Xr24h-Tab (Metformin Hcl) .Marland Kitchen.. 1 By Mouth Every Morning 2)  Tribenzor 40-5-25 Mg Tabs (Olmesartan-Amlodipine-Hctz) .Marland Kitchen.. 1 By Mouth Once Daily 3)  Tekturna 300 Mg Tabs (Aliskiren Fumarate) .Marland Kitchen.. 1 By Mouth Once Daily  Allergies (verified): 1)  ! Penicillin  Past History:  Past Medical History: Allergic rhinitis Hypertension obesity with insulin resistance Hyperlipidemia - declines statin or other rx tx   Review of Systems  The patient denies chest pain, syncope, and headaches.  Physical Exam  General:  obese, alert, well-developed, well-nourished, and cooperative to examination.    Lungs:  normal respiratory effort, no intercostal retractions or use of accessory muscles; normal breath sounds bilaterally - no crackles and no wheezes.    Heart:  normal rate, regular rhythm, no murmur, and no rub. BLE with trace pedal edema.    Impression & Recommendations:  Problem # 1:  HYPERTENSION (ICD-401.9)  His updated medication list for this problem includes:    Tribenzor 40-10-25 Mg Tabs (Olmesartan-amlodipine-hctz) .Marland Kitchen... 1 by mouth once daily    Tekturna 300 Mg Tabs (Aliskiren fumarate) .Marland Kitchen... 1 by mouth once daily  still uncontrolled -  inc dose amlodipine in tribenzor (willing to try same again for med conveince dosing single pill) and cont max tekturna -  samples given recheck 3 weeks for BP recheck and Cr/K check  BP today: 148/90 Prior BP: 150/88 (08/16/2010) Prior BP: 152/82 (07/12/2010) Prior BP: 158/84 (05/24/2010) Prior BP: 188/92 (04/11/2010) Prior BP: 190/101 (03/14/2010)  Labs Reviewed: K+: 4.3 (04/11/2010) Creat: : 1.0 (04/11/2010)   Chol: 243 (04/11/2010)   HDL: 44.70 (04/11/2010)   TG: 159.0 (04/11/2010)  Orders: Prescription Created Electronically [phone])  Problem # 2:  DIABETES MELLITUS, TYPE II (ICD-250.00)  His updated medication list for this problem includes:    Metformin Hcl 500 Mg Xr24h-tab (Metformin hcl) .Marland Kitchen... 1 by mouth every morning    Tribenzor 40-10-25 Mg Tabs (Olmesartan-amlodipine-hctz) .Marland Kitchen... 1 by mouth once daily  insulin resistance - cont metformin tx and recheck a1c q 40mo  Labs Reviewed: Creat: 1.0 (04/11/2010)    Reviewed HgBA1c results: 6.9 (04/11/2010)  Orders: TLB-A1C / Hgb A1C (Glycohemoglobin) (83036-A1C)  Problem # 3:  HYPERLIPIDEMIA (ICD-272.4)  does not wish to start med tx for same - rx'd but never started lipitor educated on need to control ASD risk factors given other comorbidities cont to work on diet and weioght loss - recheck late 09/2010 - revisit med rx if still uncontrolled    HDL:44.70 (04/11/2010)  Chol:243 (04/11/2010)  Trig:159.0 (04/11/2010)  Problem # 4:  OBESITY, MORBID (ICD-278.01)  congrats on cont weight loss - encouragement and education provided  Ht: 70 (03/14/2010)   Wt: 315.8 (03/14/2010)   BMI: 45.48 (03/14/2010)  Ht: 70 (04/11/2010)   Wt: 310.12 (04/11/2010)   BMI: 45.48 (03/14/2010)  Ht: 70 (05/24/2010)   Wt: 306 (05/24/2010)   BMI: 45.48 (03/14/2010)  Ht: 70 (07/12/2010)   Wt: 301.0 (07/12/2010)   BMI: 45.48 (03/14/2010)  Complete Medication List: 1)  Metformin Hcl 500 Mg Xr24h-tab (Metformin hcl) .Marland Kitchen.. 1 by mouth every morning 2)  Tribenzor 40-10-25 Mg Tabs (Olmesartan-amlodipine-hctz) .Marland Kitchen.. 1 by mouth once daily 3)  Tekturna 300 Mg Tabs (Aliskiren  fumarate) .Marland Kitchen.. 1 by mouth once daily  Patient Instructions: 1)  it was good to see you today. 2)  increase dose Tribenzor to 40/10/25 one tab daily 3)  continue tekturna 300mg  daily - 4)  your prescriptions have been electronically submitted to your pharmacy. Please take as directed. Contact our office if you believe you're having problems with the medication(s).  5)  good job on the weight loss so far - keep it up! 6)  Please schedule a follow-up appointment in 3 weeks to recheck blood pressure and medications, sooner if problems.  7)  will plan to recheck cholesterol winter/spring 2012 and review need for Lipitor treatment -  Prescriptions: TRIBENZOR 40-10-25 MG TABS (OLMESARTAN-AMLODIPINE-HCTZ) 1 by mouth once daily  #30 x 1   Entered and Authorized by:  Newt Lukes MD   Signed by:   Newt Lukes MD on 09/06/2010   Method used:   [facility]  [address] [id number]       [address]       [place], [place]  [address]       Ph: [phone] or [phone]       Fax: [phone]   RxID:   [id number]    Orders Added: 1)  Est. Patient Level IV [95188] 2)  Prescription Created Electronically [phone]

## 2010-10-20 NOTE — Assessment & Plan Note (Signed)
Summary: 3 mo rov /nws   Vital Signs:  Patient profile:   55 year old male Height:      70 inches (177.80 cm) Weight:      297.8 pounds (135.36 kg) O2 Sat:      95 % on Room air Temp:     98.7 degrees F (37.06 degrees C) oral Pulse rate:   71 / minute BP sitting:   148 / 88  (left arm) Cuff size:   large  Vitals Entered By: Orlan Leavens RMA (October 04, 2010 1:33 PM)  O2 Flow:  Room air CC: 3 month follow-up Is Patient Diabetic? Yes Did you bring your meter with you today? No Pain Assessment Patient in pain? no        Primary Care Provider:  Newt Lukes MD  CC:  3 month follow-up.  History of Present Illness: here for f/u -  1) HTN - dx 03/02/10 - begun on hctz  then added ACEI 02/2010 - then added amlodipine end 03/2010, inc dose 9/201 - changed to combo tribenzor 07/12/10; tried tekturna - no change noted on bp- reports 100% compliance with ongoing medical treatment and no changes in medication dose or frequency. denies adverse side effects related to current therapy. mild chronic edema ankles, no CP or other HA - no vision changes  2) obesity - hx insulin resistance "but not DM" per pt - denies weight gain but c/o difficulty losing weight despite physcial activty and diet changes - congrats on 1# down since last OV, 10# since est care  3) HA - seen in ER 03/02/10 for same - symptoms improved since starting meds for BP control - no HA today - no weakness or vison changes - symptoms last <3 hours per episode, no aura or migraine hx - occ use tylenol for same  4) seasonal allg - occ use OTC meds for lfares - no current or active symptoms - no sneezing, runny nose or sinus pressure - no nasal dc or cough   5) dyslipidemia - has not begun on meds rx 03/2010 due to concerns over poss SE - comitted to  low fat diet and cont weight loss  6) DM2 - started metformin low dose for same 04/2010 - not monitoring cbgs but reports compliance with ongoing medical treatment and no changes  in medication dose or frequency. denies adverse side effects related to current therapy.   Clinical Review Panels:  Lipid Management   Cholesterol:  243 (04/11/2010)   HDL (good cholesterol):  44.70 (04/11/2010)  Diabetes Management   HgBA1C:  6.3 (07/12/2010)   Creatinine:  1.0 (04/11/2010)  CBC   WBC:  8.6 (03/02/2010)   Hgb:  17.0 (03/02/2010)   Hct:  45.7 (03/02/2010)   Platelets:  230 (03/02/2010)  Complete Metabolic Panel   Glucose:  118 (04/11/2010)   Sodium:  135 (04/11/2010)   Potassium:  4.3 (04/11/2010)   Chloride:  100 (04/11/2010)   CO2:  28 (04/11/2010)   BUN:  20 (04/11/2010)   Creatinine:  1.0 (04/11/2010)   Calcium:  9.0 (04/11/2010)   Current Medications (verified): 1)  Metformin Hcl 500 Mg Xr24h-Tab (Metformin Hcl) .Marland Kitchen.. 1 By Mouth Every Morning 2)  Tribenzor 40-10-25 Mg Tabs (Olmesartan-Amlodipine-Hctz) .Marland Kitchen.. 1 By Mouth Once Daily 3)  Tekturna 300 Mg Tabs (Aliskiren Fumarate) .Marland Kitchen.. 1 By Mouth Once Daily  Allergies (verified): 1)  ! Penicillin  Past History:  Past Medical History: Allergic rhinitis Hypertension obesity with insulin resistance DM2 Hyperlipidemia -  declines statin or other rx tx   Review of Systems  The patient denies weight gain, chest pain, peripheral edema, and headaches.    Physical Exam  General:  obese, alert, well-developed, well-nourished, and cooperative to examination.    Lungs:  normal respiratory effort, no intercostal retractions or use of accessory muscles; normal breath sounds bilaterally - no crackles and no wheezes.    Heart:  normal rate, regular rhythm, no murmur, and no rub. BLE with trace pedal edema.  Psych:  reserved, quiet - Oriented X3, memory intact for recent and remote, normally interactive, good eye contact, not anxious appearing, not depressed appearing, and not agitated.      Impression & Recommendations:  Problem # 1:  HYPERTENSION (ICD-401.9)  The following medications were removed from the  medication list:    Tekturna 300 Mg Tabs (Aliskiren fumarate) .Marland Kitchen... 1 by mouth once daily His updated medication list for this problem includes:    Tribenzor 40-10-25 Mg Tabs (Olmesartan-amlodipine-hctz) .Marland Kitchen... 1 by mouth once daily    Bystolic 20 Mg Tabs (Nebivolol hcl) .Marland Kitchen... 1 by mouth once daily  still uncontrolled -  on max dose tribenzo stop tekturna - start bystolic - samples given recheck 3 weeks for BP recheck and labs  BP today: 148/88 Prior BP: 148/90 (09/06/2010) Prior BP: 150/88 (08/16/2010) Prior BP: 152/82 (07/12/2010) Prior BP: 158/84 (05/24/2010) Prior BP: 188/92 (04/11/2010) Prior BP: 190/101 (03/14/2010)  Labs Reviewed: K+: 4.3 (04/11/2010) Creat: : 1.0 (04/11/2010)   Chol: 243 (04/11/2010)   HDL: 44.70 (04/11/2010)   TG: 159.0 (04/11/2010)  Orders: Prescription Created Electronically [phone])  Problem # 2:  DIABETES MELLITUS, TYPE II (ICD-250.00)  His updated medication list for this problem includes:    Metformin Hcl 500 Mg Xr24h-tab (Metformin hcl) .Marland Kitchen... 1 by mouth every morning    Tribenzor 40-10-25 Mg Tabs (Olmesartan-amlodipine-hctz) .Marland Kitchen... 1 by mouth once daily  insulin resistance - cont metformin tx and recheck a1c q 37mo  Labs Reviewed: Creat: 1.0 (04/11/2010)    Reviewed HgBA1c results: 6.3 (07/12/2010)  6.9 (04/11/2010)  Problem # 3:  OBESITY, MORBID (ICD-278.01)  congrats on cont weight loss - encouragement and education provided  Ht: 70 (03/14/2010)   Wt: 315.8 (03/14/2010)   BMI: 45.48 (03/14/2010)  Ht: 70 (04/11/2010)   Wt: 310.12 (04/11/2010)   BMI: 45.48 (03/14/2010)  Ht: 70 (05/24/2010)   Wt: 306 (05/24/2010)   BMI: 45.48 (03/14/2010)  Ht: 70 (07/12/2010)   Wt: 301.0 (07/12/2010)   BMI: 45.48 (03/14/2010)  Ht: 70 (10/04/2010)   Wt: 297.8 (10/04/2010)   BMI: 45.48 (03/14/2010)  Complete Medication List: 1)  Metformin Hcl 500 Mg Xr24h-tab (Metformin hcl) .Marland Kitchen.. 1 by mouth every morning 2)  Tribenzor 40-10-25 Mg Tabs  (Olmesartan-amlodipine-hctz) .Marland Kitchen.. 1 by mouth once daily 3)  Bystolic 20 Mg Tabs (Nebivolol hcl) .Marland Kitchen.. 1 by mouth once daily  Patient Instructions: 1)  it was good to see you today. 2)  continue Tribenzor to 40/10/25 one tab daily 3)  stop tekturna 300mg  daily - 4)  start bystolic 20mg  once daily - 2 weeks samples given today and your prescription has been electronically submitted to your pharmacy. Please take as directed. Contact our office if you believe you're having problems with the medication(s).  5)  good job on the weight loss so far - keep it up! 6)  Please schedule a follow-up appointment in 3 weeks to recheck blood pressure and medications, sooner if problems- will check a1c that visit 7)  will plan to recheck  cholesterol spring/summer 2012 and review need for Lipitor treatment -  Prescriptions: BYSTOLIC 20 MG TABS (NEBIVOLOL HCL) 1 by mouth once daily  #30 x 3   Entered and Authorized by:   Newt Lukes MD   Signed by:   Newt Lukes MD on 10/04/2010   Method used:   [facility] to        [facility]  [address] [id number]       [address]       [place], [place]  [address]       Ph: [phone] or [phone]       Fax: [phone]   RxID:   [id number]    Orders Added: 1)  Est. Patient Level IV [564[address]] 2)  Prescription Created Electronically [phone]

## 2010-10-25 ENCOUNTER — Other Ambulatory Visit: Payer: Self-pay | Admitting: Internal Medicine

## 2010-10-25 ENCOUNTER — Ambulatory Visit (INDEPENDENT_AMBULATORY_CARE_PROVIDER_SITE_OTHER): Payer: BC Managed Care – PPO | Admitting: Internal Medicine

## 2010-10-25 ENCOUNTER — Encounter: Payer: Self-pay | Admitting: Internal Medicine

## 2010-10-25 ENCOUNTER — Other Ambulatory Visit: Payer: BC Managed Care – PPO

## 2010-10-25 DIAGNOSIS — E119 Type 2 diabetes mellitus without complications: Secondary | ICD-10-CM

## 2010-10-25 DIAGNOSIS — I10 Essential (primary) hypertension: Secondary | ICD-10-CM

## 2010-10-25 LAB — HEMOGLOBIN A1C: Hgb A1c MFr Bld: 6.2 % (ref 4.6–6.5)

## 2010-11-03 NOTE — Assessment & Plan Note (Signed)
Summary: 3 WK NWS   Vital Signs:  Patient profile:   55 year old male Height:      70 inches (177.80 cm) Weight:      295.2 pounds (134.18 kg) O2 Sat:      96 % on Room air Temp:     98.7 degrees F (37.06 degrees C) oral Pulse rate:   62 / minute BP sitting:   132 / 90  (left arm) Cuff size:   large  Vitals Entered By: Orlan Leavens RMA (October 25, 2010 1:29 PM)  O2 Flow:  Room air CC: 3 week follow-up Is Patient Diabetic? Yes Did you bring your meter with you today? No Pain Assessment Patient in pain? no        Primary Care Provider:  Newt Lukes MD  CC:  3 week follow-up.  History of Present Illness: here for f/u -  1) HTN - dx 03/02/10 - begun on hctz  then added ACEI 02/2010 - then added amlodipine end 03/2010, inc dose 9/201 - changed to combo tribenzor 07/12/10; tried tekturna - now on bystolic - reports 100% compliance with ongoing medical treatment and no changes in medication dose or frequency. denies adverse side effects related to current therapy. mild chronic edema ankles, no CP or other HA - no vision changes  2) obesity - hx insulin resistance "but not DM" per pt - started at 315.8# 02/2010 -denies weight gain and attributes slow but steady weight loss to diet and inc physcial activty  3) HA - seen in ER 03/02/10 for same - symptoms improved since starting meds for BP control - no HA today - no weakness or vison changes - symptoms last <3 hours per episode, no aura or migraine hx - occ use tylenol for same  4) seasonal allg - occ use OTC meds for lfares - no current or active symptoms - no sneezing, runny nose or sinus pressure - no nasal dc or cough   5) dyslipidemia - has not begun on meds rx 03/2010 due to concerns over poss SE - comitted to  low fat diet and cont weight loss  6) DM2 - started metformin low dose for same 04/2010 - not monitoring cbgs but reports compliance with ongoing medical treatment and no changes in medication dose or frequency. denies  adverse side effects related to current therapy.   Clinical Review Panels:  Lipid Management   Cholesterol:  243 (04/11/2010)   HDL (good cholesterol):  44.70 (04/11/2010)  Diabetes Management   HgBA1C:  6.3 (07/12/2010)   Creatinine:  1.0 (04/11/2010)  CBC   WBC:  8.6 (03/02/2010)   Hgb:  17.0 (03/02/2010)   Hct:  45.7 (03/02/2010)   Platelets:  230 (03/02/2010)  Complete Metabolic Panel   Glucose:  118 (04/11/2010)   Sodium:  135 (04/11/2010)   Potassium:  4.3 (04/11/2010)   Chloride:  100 (04/11/2010)   CO2:  28 (04/11/2010)   BUN:  20 (04/11/2010)   Creatinine:  1.0 (04/11/2010)   Calcium:  9.0 (04/11/2010)   Current Medications (verified): 1)  Metformin Hcl 500 Mg Xr24h-Tab (Metformin Hcl) .Marland Kitchen.. 1 By Mouth Every Morning 2)  Tribenzor 40-10-25 Mg Tabs (Olmesartan-Amlodipine-Hctz) .Marland Kitchen.. 1 By Mouth Once Daily 3)  Bystolic 20 Mg Tabs (Nebivolol Hcl) .Marland Kitchen.. 1 By Mouth Once Daily  Allergies (verified): 1)  ! Penicillin  Past History:  Past Medical History: Allergic rhinitis Hypertension obesity with insulin resistance DM2 Hyperlipidemia - declines statin or other rx tx  Review of Systems  The patient denies weight gain, chest pain, syncope, dyspnea on exertion, and headaches.    Physical Exam  General:  obese, alert, well-developed, well-nourished, and cooperative to examination.    Lungs:  normal respiratory effort, no intercostal retractions or use of accessory muscles; normal breath sounds bilaterally - no crackles and no wheezes.    Heart:  normal rate, regular rhythm, no murmur, and no rub. BLE with trace pedal edema.  Psych:  reserved, quiet - Oriented X3, memory intact for recent and remote, normally interactive, good eye contact, not anxious appearing, not depressed appearing, and not agitated.      Impression & Recommendations:  Problem # 1:  HYPERTENSION (ICD-401.9)  His updated medication list for this problem includes:    Tribenzor 40-10-25 Mg  Tabs (Olmesartan-amlodipine-hctz) .Marland Kitchen... 1 by mouth once daily    Bystolic 20 Mg Tabs (Nebivolol hcl) .Marland Kitchen... 1 by mouth once daily  improved! on max dose tribenzor and bystolic - doing well - cont same  BP today: 132/90 Prior BP: 148/88 (10/04/2010) Prior BP: 148/90 (09/06/2010) Prior BP: 150/88 (08/16/2010) Prior BP: 152/82 (07/12/2010) Prior BP: 158/84 (05/24/2010) Prior BP: 188/92 (04/11/2010) Prior BP: 190/101 (03/14/2010)  Labs Reviewed: K+: 4.3 (04/11/2010) Creat: : 1.0 (04/11/2010)   Chol: 243 (04/11/2010)   HDL: 44.70 (04/11/2010)   TG: 159.0 (04/11/2010)  Problem # 2:  DIABETES MELLITUS, TYPE II (ICD-250.00)  His updated medication list for this problem includes:    Metformin Hcl 500 Mg Xr24h-tab (Metformin hcl) .Marland Kitchen... 1 by mouth every morning    Tribenzor 40-10-25 Mg Tabs (Olmesartan-amlodipine-hctz) .Marland Kitchen... 1 by mouth once daily  Orders: TLB-A1C / Hgb A1C (Glycohemoglobin) (83036-A1C)  insulin resistance - cont metformin tx and recheck a1c q 51mo  Labs Reviewed: Creat: 1.0 (04/11/2010)    Reviewed HgBA1c results: 6.3 (07/12/2010)  6.9 (04/11/2010)  Problem # 3:  OBESITY, MORBID (ICD-278.01)  congrats on cont weight loss - encouragement and education provided  Ht: 70 (03/14/2010)   Wt: 315.8 (03/14/2010)   BMI: 45.48 (03/14/2010)  Ht: 70 (04/11/2010)   Wt: 310.12 (04/11/2010)   BMI: 45.48 (03/14/2010)  Ht: 70 (05/24/2010)   Wt: 306 (05/24/2010)   BMI: 45.48 (03/14/2010)  Ht: 70 (07/12/2010)   Wt: 301.0 (07/12/2010)   BMI: 45.48 (03/14/2010)  Ht: 70 (10/04/2010)   Wt: 297.8 (10/04/2010)   BMI: 45.48 (03/14/2010)  Ht: 70 (10/25/2010)   Wt: 295.2 (10/25/2010)   BMI: 45.48 (03/14/2010)  Problem # 4:  HYPERLIPIDEMIA (ICD-272.4)  does not wish to start med tx for same - rx'd but never started lipitor educated on need to control ASD risk factors given other comorbidities cont to work on diet and weioght loss - recheck next OV - revisit med rx if still  uncontrolled    HDL:44.70 (04/11/2010)  Chol:243 (04/11/2010)  Trig:159.0 (04/11/2010)  Complete Medication List: 1)  Metformin Hcl 500 Mg Xr24h-tab (Metformin hcl) .Marland Kitchen.. 1 by mouth every morning 2)  Tribenzor 40-10-25 Mg Tabs (Olmesartan-amlodipine-hctz) .Marland Kitchen.. 1 by mouth once daily 3)  Bystolic 20 Mg Tabs (Nebivolol hcl) .Marland Kitchen.. 1 by mouth once daily  Patient Instructions: 1)  it was good to see you today. 2)  continue Tribenzor to 40/10/25 one tab daily 3)  continue bystolic 20mg  once daily  4)  good job on the weight loss so far - keep it up! 5)  test(s) ordered today - your results will be posted on the phone tree for review in 48-72 hours from the time of test completion;  call [phone] and enter your 9 digit MRN (listed above on this page, just below your name); if any changes need to be made or there are abnormal results, you will be contacted directly.  6)  Please schedule a follow-up appointment in 3 months to recheck blood pressure and medications, sooner if problems- will check a1c and cholesterol next visit and review need for Lipitor treatment -  Prescriptions: BYSTOLIC 20 MG TABS (NEBIVOLOL HCL) 1 by mouth once daily  #90 x 1   Entered and Authorized by:   Newt Lukes MD   Signed by:   Newt Lukes MD on 10/25/2010   Method used:   [facility]  [address] [id number]       [address]       [place], [place]  [address]       Ph: [phone] or [phone]       Fax: [phone]   RxID:   [id number] 40-10-25 MG TABS [facility]) 1 by mouth once daily  #90 x 1   Entered and Authorized by:   Newt Lukes MD   Signed by:   Newt Lukes MD on 10/25/2010   Method used:   [facility]        [facility]  [address] [id number]       [address]       [place], [place]  [address]       Ph: [phone] or [phone]       Fax: [phone]   RxID:    [id number]    Orders Added: 1)  TLB-A1C / Hgb A1C (Glycohemoglobin) [83036-A1C] 2)  Est. Patient Level IV [07371]

## 2010-11-22 ENCOUNTER — Telehealth: Payer: Self-pay | Admitting: Internal Medicine

## 2010-11-29 NOTE — Progress Notes (Signed)
Summary: Rx req  Phone Note Refill Request Message from:  Patient on November 22, 2010 1:42 PM  Refills Requested: Medication #1:  TRIBENZOR 40-10-25 MG TABS 1 by mouth once daily   Dosage confirmed as above?Dosage Confirmed   Supply Requested: 3 months Pt called requesting new Rx for #90 per [employer]   Method Requested: Electronic Initial call taken by: Margaret Pyle, CMA,  November 22, 2010 1:43 PM    Prescriptions: Marya Landry 40-10-25 MG TABS [facility]) 1 by mouth once daily  #90 x 0   Entered by:   Margaret Pyle, CMA   Authorized by:   Newt Lukes MD   Signed by:   Margaret Pyle, CMA on 11/22/2010   Method used:   [facility]        [facility]  [address] [id number]       [address]       [place], [place]  [address]       Ph: [phone] or [phone]       Fax: [phone]   RxID:   [id number]

## 2010-12-04 LAB — DIFFERENTIAL
Basophils Absolute: 0 10*3/uL (ref 0.0–0.1)
Basophils Relative: 0 % (ref 0–1)
Eosinophils Absolute: 0 10*3/uL (ref 0.0–0.7)
Lymphocytes Relative: 9 % — ABNORMAL LOW (ref 12–46)
Lymphs Abs: 0.8 10*3/uL (ref 0.7–4.0)
Monocytes Absolute: 0.2 10*3/uL (ref 0.1–1.0)
Neutro Abs: 7.6 10*3/uL (ref 1.7–7.7)
Neutrophils Relative %: 88 % — ABNORMAL HIGH (ref 43–77)

## 2010-12-04 LAB — POCT I-STAT, CHEM 8
Hemoglobin: 17 g/dL (ref 13.0–17.0)
TCO2: 29 mmol/L (ref 0–100)

## 2010-12-04 LAB — POCT CARDIAC MARKERS
CKMB, poc: <1 ng/mL — ABNORMAL LOW (ref 1.0–8.0)
Myoglobin, poc: 90.7 ng/mL (ref 12–200)

## 2010-12-04 LAB — CBC
Hemoglobin: 15.8 g/dL (ref 13.0–17.0)
Platelets: 230 10*3/uL (ref 150–400)

## 2010-12-12 ENCOUNTER — Other Ambulatory Visit: Payer: Self-pay | Admitting: Internal Medicine

## 2011-01-24 ENCOUNTER — Ambulatory Visit: Payer: BC Managed Care – PPO | Admitting: Internal Medicine

## 2011-01-27 ENCOUNTER — Encounter: Payer: Self-pay | Admitting: Internal Medicine

## 2011-02-04 ENCOUNTER — Other Ambulatory Visit: Payer: Self-pay | Admitting: Internal Medicine

## 2011-02-07 ENCOUNTER — Encounter: Payer: Self-pay | Admitting: Internal Medicine

## 2011-02-08 ENCOUNTER — Ambulatory Visit (INDEPENDENT_AMBULATORY_CARE_PROVIDER_SITE_OTHER): Payer: BC Managed Care – PPO | Admitting: Internal Medicine

## 2011-02-08 ENCOUNTER — Encounter: Payer: Self-pay | Admitting: Internal Medicine

## 2011-02-08 ENCOUNTER — Other Ambulatory Visit (INDEPENDENT_AMBULATORY_CARE_PROVIDER_SITE_OTHER): Payer: BC Managed Care – PPO | Admitting: Internal Medicine

## 2011-02-08 ENCOUNTER — Other Ambulatory Visit (INDEPENDENT_AMBULATORY_CARE_PROVIDER_SITE_OTHER): Payer: BC Managed Care – PPO

## 2011-02-08 DIAGNOSIS — Z13220 Encounter for screening for lipoid disorders: Secondary | ICD-10-CM

## 2011-02-08 DIAGNOSIS — E785 Hyperlipidemia, unspecified: Secondary | ICD-10-CM

## 2011-02-08 DIAGNOSIS — I10 Essential (primary) hypertension: Secondary | ICD-10-CM

## 2011-02-08 DIAGNOSIS — E119 Type 2 diabetes mellitus without complications: Secondary | ICD-10-CM

## 2011-02-08 LAB — LIPID PANEL
Cholesterol: 207 mg/dL — ABNORMAL HIGH (ref 0–200)
Total CHOL/HDL Ratio: 4
Triglycerides: 83 mg/dL (ref 0.0–149.0)
VLDL: 16.6 mg/dL (ref 0.0–40.0)

## 2011-02-08 LAB — BASIC METABOLIC PANEL
Glucose, Bld: 106 mg/dL — ABNORMAL HIGH (ref 70–99)
Potassium: 4.2 mEq/L (ref 3.5–5.1)
Sodium: 140 mEq/L (ref 135–145)

## 2011-02-08 LAB — TSH: TSH: 1.49 u[IU]/mL (ref 0.35–5.50)

## 2011-02-08 NOTE — Patient Instructions (Addendum)
It was good to see you today. Medications reviewed, no changes at this time. Test(s) ordered today. Your results will be called to you after review (48-72hours after test completion). If any changes need to be made, you will be notified at that time. Work on lifestyle changes as discussed (low fat, low carb, increased protein diet; improved exercise efforts; weight loss) to control sugar, blood pressure and cholesterol levels and/or reduce risk of developing other medical problems. Look into [email or web address] or other type of food journal to assist you in this process. Will consider Crestor or other cholesterol medications if needed Please schedule followup in 3-4 months, call sooner if problems.

## 2011-02-08 NOTE — Progress Notes (Signed)
  Subjective:    Patient ID: Jose Pacheco, male    DOB: [birth year], 55 y.o.   MRN: [id number]  HPI Here for follow up - reviewed chronic med issues:  HTN - dx 03/02/10 -multiple med trials - remains difficult to control -reports 100% compliance with ongoing medical treatment and no changes in medication dose or frequency. denies adverse side effects related to current therapy. mild chronic edema ankles, no CP or other HA - no vision changes  Morbid obesity -started at 315.8# 02/2010 -denies weight gain and attributes slow but steady weight loss to diet and inc physcial activty  dyslipidemia - did not begin meds rx 03/2010 due to concerns over poss SE - was committed to low fat diet and cont weight loss - bit "backslide" reviewed  DM2 - started metformin low dose for same 04/2010 - not monitoring cbgs but reports compliance with ongoing medical treatment and no changes in medication dose or frequency. denies adverse side effects related to current therapy.   Past Medical History  Diagnosis Date  . OBESITY, MORBID   . DIABETES MELLITUS, TYPE II   . ALLERGIC RHINITIS   . HYPERLIPIDEMIA   . HYPERTENSION       Review of Systems  Eyes: Negative for visual disturbance.  Respiratory: Negative for cough and shortness of breath.   Cardiovascular: Negative for chest pain.       Objective:   Physical Exam BP 146/90  Pulse 54  Temp(Src) 97.9 F (36.6 C) (Oral)  Ht 5\' 10"  (1.778 m)  Wt 303 lb 9.6 oz (137.712 kg)  BMI 43.56 kg/m2  SpO2 94%  Physical Exam  Constitutional:  oriented to person, place, and time. appears well-developed and well-nourished. No distress.  Neck: Thick. Normal range of motion. Neck supple. No JVD present. No thyromegaly present.  Cardiovascular: Normal rate, regular rhythm and normal heart sounds.  No murmur heard. No BLE edema Pulmonary/Chest: Effort normal and breath sounds normal. No respiratory distress. no wheezes.  Psychiatric: he has a normal mood and  affect. behavior is normal. Judgment and thought content normal.   Lab Results  Component Value Date   WBC 8.6 03/02/2010   HGB 17.0 03/02/2010   HCT 50.0 03/02/2010   PLT 230 03/02/2010   CHOL 243* 04/11/2010   TRIG 159.0* 04/11/2010   HDL 44.70 04/11/2010   LDLDIRECT 166.7 04/11/2010   NA 135 04/11/2010   K 4.3 04/11/2010   CL 100 04/11/2010   CREATININE 1.0 04/11/2010   BUN 20 04/11/2010   CO2 28 04/11/2010   TSH 1.66 04/11/2010   HGBA1C 6.2 10/25/2010       Wt Readings from Last 3 Encounters:  02/08/11 303 lb 9.6 oz (137.712 kg)  10/25/10 295 lb 3.2 oz (133.902 kg)  10/04/10 297 lb 12.8 oz (135.081 kg)      Assessment & Plan:  See problem list. Medications and labs reviewed today.

## 2011-02-08 NOTE — Assessment & Plan Note (Signed)
Difficult to control- initial dx 03/02/10 - begun on hctz  then added ACEI 02/2010 - then added amlodipine end 03/2010, inc dose 05/2010 changed to combo tribenzor 07/12/10; tried tekturna but stopped - then added bystolic - tolerating reasonably well  continue present plan and medications and resume lifestyle control as well BP Readings from Last 3 Encounters:  02/08/11 146/90  10/25/10 132/90  10/04/10 148/88

## 2011-02-08 NOTE — Assessment & Plan Note (Signed)
Suggested consideration of lap band or other bariatric intervention - To resume lifestyle control efforts with diet and exercise - agrees to same Wt Readings from Last 3 Encounters:  02/08/11 303 lb 9.6 oz (137.712 kg)  10/25/10 295 lb 3.2 oz (133.902 kg)  10/04/10 297 lb 12.8 oz (135.081 kg)

## 2011-02-08 NOTE — Assessment & Plan Note (Signed)
Started metformin 04/2010 - weight gain noted  Lab Results  Component Value Date   HGBA1C 6.2 10/25/2010

## 2011-02-08 NOTE — Assessment & Plan Note (Signed)
Prev rec to start lipitor but declined due to concerns over potential SE Back slide on weight loss discussed - Recheck lipids now, consider alt statin (?crestor) if tx needed

## 2011-02-09 ENCOUNTER — Other Ambulatory Visit: Payer: Self-pay | Admitting: Internal Medicine

## 2011-02-20 ENCOUNTER — Telehealth: Payer: Self-pay

## 2011-02-20 NOTE — Telephone Encounter (Signed)
Pt called stating [employer] will no longer cover Tribenzor. Pt is requesting alternative medication, please advise.

## 2011-02-20 NOTE — Telephone Encounter (Signed)
Break tribenzor meds into 2 pills: Azor 10-40 (copay card from drug company available for pick up and 90d supply) as well as HCTZ 25 qd - erx both new pills done - combined these 2 pills same as prior tribenzor - thanks

## 2011-02-21 ENCOUNTER — Other Ambulatory Visit: Payer: Self-pay | Admitting: Internal Medicine

## 2011-02-21 NOTE — Telephone Encounter (Signed)
Left message on machine for pt to return my call  

## 2011-02-22 MED ORDER — AMLODIPINE-OLMESARTAN 10-40 MG PO TABS: 1.0000 | ORAL_TABLET | Freq: Every day | ORAL | Status: DC

## 2011-02-22 MED ORDER — HYDROCHLOROTHIAZIDE 25 MG PO TABS: 25.0000 mg | ORAL_TABLET | Freq: Every day | ORAL | Status: DC

## 2011-02-22 NOTE — Telephone Encounter (Signed)
Left message on machine for pt to return my call  

## 2011-06-21 ENCOUNTER — Other Ambulatory Visit (INDEPENDENT_AMBULATORY_CARE_PROVIDER_SITE_OTHER): Payer: BC Managed Care – PPO

## 2011-06-21 ENCOUNTER — Encounter: Payer: Self-pay | Admitting: Internal Medicine

## 2011-06-21 ENCOUNTER — Ambulatory Visit (INDEPENDENT_AMBULATORY_CARE_PROVIDER_SITE_OTHER): Payer: BC Managed Care – PPO | Admitting: Internal Medicine

## 2011-06-21 DIAGNOSIS — I10 Essential (primary) hypertension: Secondary | ICD-10-CM

## 2011-06-21 DIAGNOSIS — E119 Type 2 diabetes mellitus without complications: Secondary | ICD-10-CM

## 2011-06-21 DIAGNOSIS — E785 Hyperlipidemia, unspecified: Secondary | ICD-10-CM

## 2011-06-21 NOTE — Progress Notes (Signed)
  Subjective:    Patient ID: Jose Pacheco, male    DOB: 12-21-1955, 55 y.o.   MRN: [id number]  HPI  Here for follow up - reviewed chronic med issues:  HTN - dx 03/02/10 -multiple med trials - remains difficult to control -reports 100% compliance with ongoing medical treatment and no changes in medication dose or frequency. denies adverse side effects related to current therapy. mild chronic edema ankles, no CP or other HA - no vision changes  Morbid obesity -started at 315.8# 02/2010 -denies weight gain and attributes slow but steady weight loss to diet and inc physcial activty  dyslipidemia - did not begin meds rx 03/2010 due to concerns over poss SE - was committed to low fat diet and cont weight loss - bit "backslide" reviewed  DM2 - started metformin low dose for same 04/2010 - not monitoring cbgs but reports compliance with ongoing medical treatment and no changes in medication dose or frequency. denies adverse side effects related to current therapy.   Past Medical History  Diagnosis Date  . OBESITY, MORBID   . DIABETES MELLITUS, TYPE II   . ALLERGIC RHINITIS   . HYPERLIPIDEMIA   . HYPERTENSION      Review of Systems  Eyes: Negative for visual disturbance.  Respiratory: Negative for cough and shortness of breath.   Cardiovascular: Negative for chest pain.       Objective:   Physical Exam  BP 150/78  Pulse 63  Temp(Src) 98.3 F (36.8 C) (Oral)  Wt 316 lb (143.337 kg)  SpO2 93% Wt Readings from Last 3 Encounters:  06/21/11 316 lb (143.337 kg)  02/08/11 303 lb 9.6 oz (137.712 kg)  10/25/10 295 lb 3.2 oz (133.902 kg)   Constitutional:  obese. appears well-developed and well-nourished. No distress.  Neck: Thick. Normal range of motion. Neck supple. No JVD present. No thyromegaly present.  Cardiovascular: Normal rate, regular rhythm and normal heart sounds.  No murmur heard. No BLE edema Pulmonary/Chest: Effort normal and breath sounds normal. No respiratory distress. no  wheezes.  Psychiatric: he has a normal mood and affect. behavior is normal. Judgment and thought content normal.   Lab Results  Component Value Date   WBC 8.6 03/02/2010   HGB 17.0 03/02/2010   HCT 50.0 03/02/2010   PLT 230 03/02/2010   CHOL 207* 02/08/2011   TRIG 83.0 02/08/2011   HDL 52.40 02/08/2011   LDLDIRECT 153.2 02/08/2011   NA 140 02/08/2011   K 4.2 02/08/2011   CL 100 02/08/2011   CREATININE 1.0 02/08/2011   BUN 18 02/08/2011   CO2 31 02/08/2011   TSH 1.49 02/08/2011   HGBA1C 6.2 02/08/2011     Assessment & Plan:  See problem list. Medications and labs reviewed today.

## 2011-06-21 NOTE — Assessment & Plan Note (Signed)
Prev rec to start lipitor but declined due to concerns over potential SE Back slide on weight loss discussed - Reviewed last lipids - LDL>150 - but still refuses med tx  consider alt statin (?crestor) if tx needed in future

## 2011-06-21 NOTE — Assessment & Plan Note (Signed)
Suggested consideration of lap band or other bariatric intervention - To resume lifestyle control efforts with diet and exercise -  agrees to try same but also admits ongoing obstacles to same Wt Readings from Last 3 Encounters:  06/21/11 316 lb (143.337 kg)  02/08/11 303 lb 9.6 oz (137.712 kg)  10/25/10 295 lb 3.2 oz (133.902 kg)

## 2011-06-21 NOTE — Assessment & Plan Note (Signed)
Started metformin 04/2010 - weight gain noted Check a1c today - adjust meds if needed Lab Results  Component Value Date   HGBA1C 6.2 02/08/2011

## 2011-06-21 NOTE — Assessment & Plan Note (Signed)
Difficult to control- initial dx 03/02/10 -  begun on hctz  then added ACEI 02/2010 - then added amlodipine 03/2010, inc dose 05/2010 changed to combo tribenzor 07/12/10;  tried tekturna but stopped when recalled-  added bystolic to tribenzor - tolerating reasonably well resume lifestyle control   BP Readings from Last 3 Encounters:  06/21/11 150/78  02/08/11 146/90  10/25/10 132/90

## 2011-06-21 NOTE — Patient Instructions (Signed)
It was good to see you today. Medications reviewed, no changes at this time. Test(s) ordered today. Your results will be called to you after review (48-72hours after test completion). If any changes need to be made, you will be notified at that time. Continue to work on lifestyle changes as discussed (low fat, low carb, increased protein diet; improved exercise efforts; weight loss) to control sugar, blood pressure and cholesterol levels and/or reduce risk of developing other medical problems. Look into [email or web address] or other type of food journal to assist you in this process. Will need to consider Crestor or other cholesterol medications if LDL Please schedule followup in 3-4 months, call sooner if problems.

## 2011-06-26 IMAGING — CR DG CHEST 2V
2 series · 2 of 2 positions shown · non-contrast
Comparison: None.

CLINICAL DATA: Hypertension/dizziness

CHEST - 2 VIEW

[w chest pa]
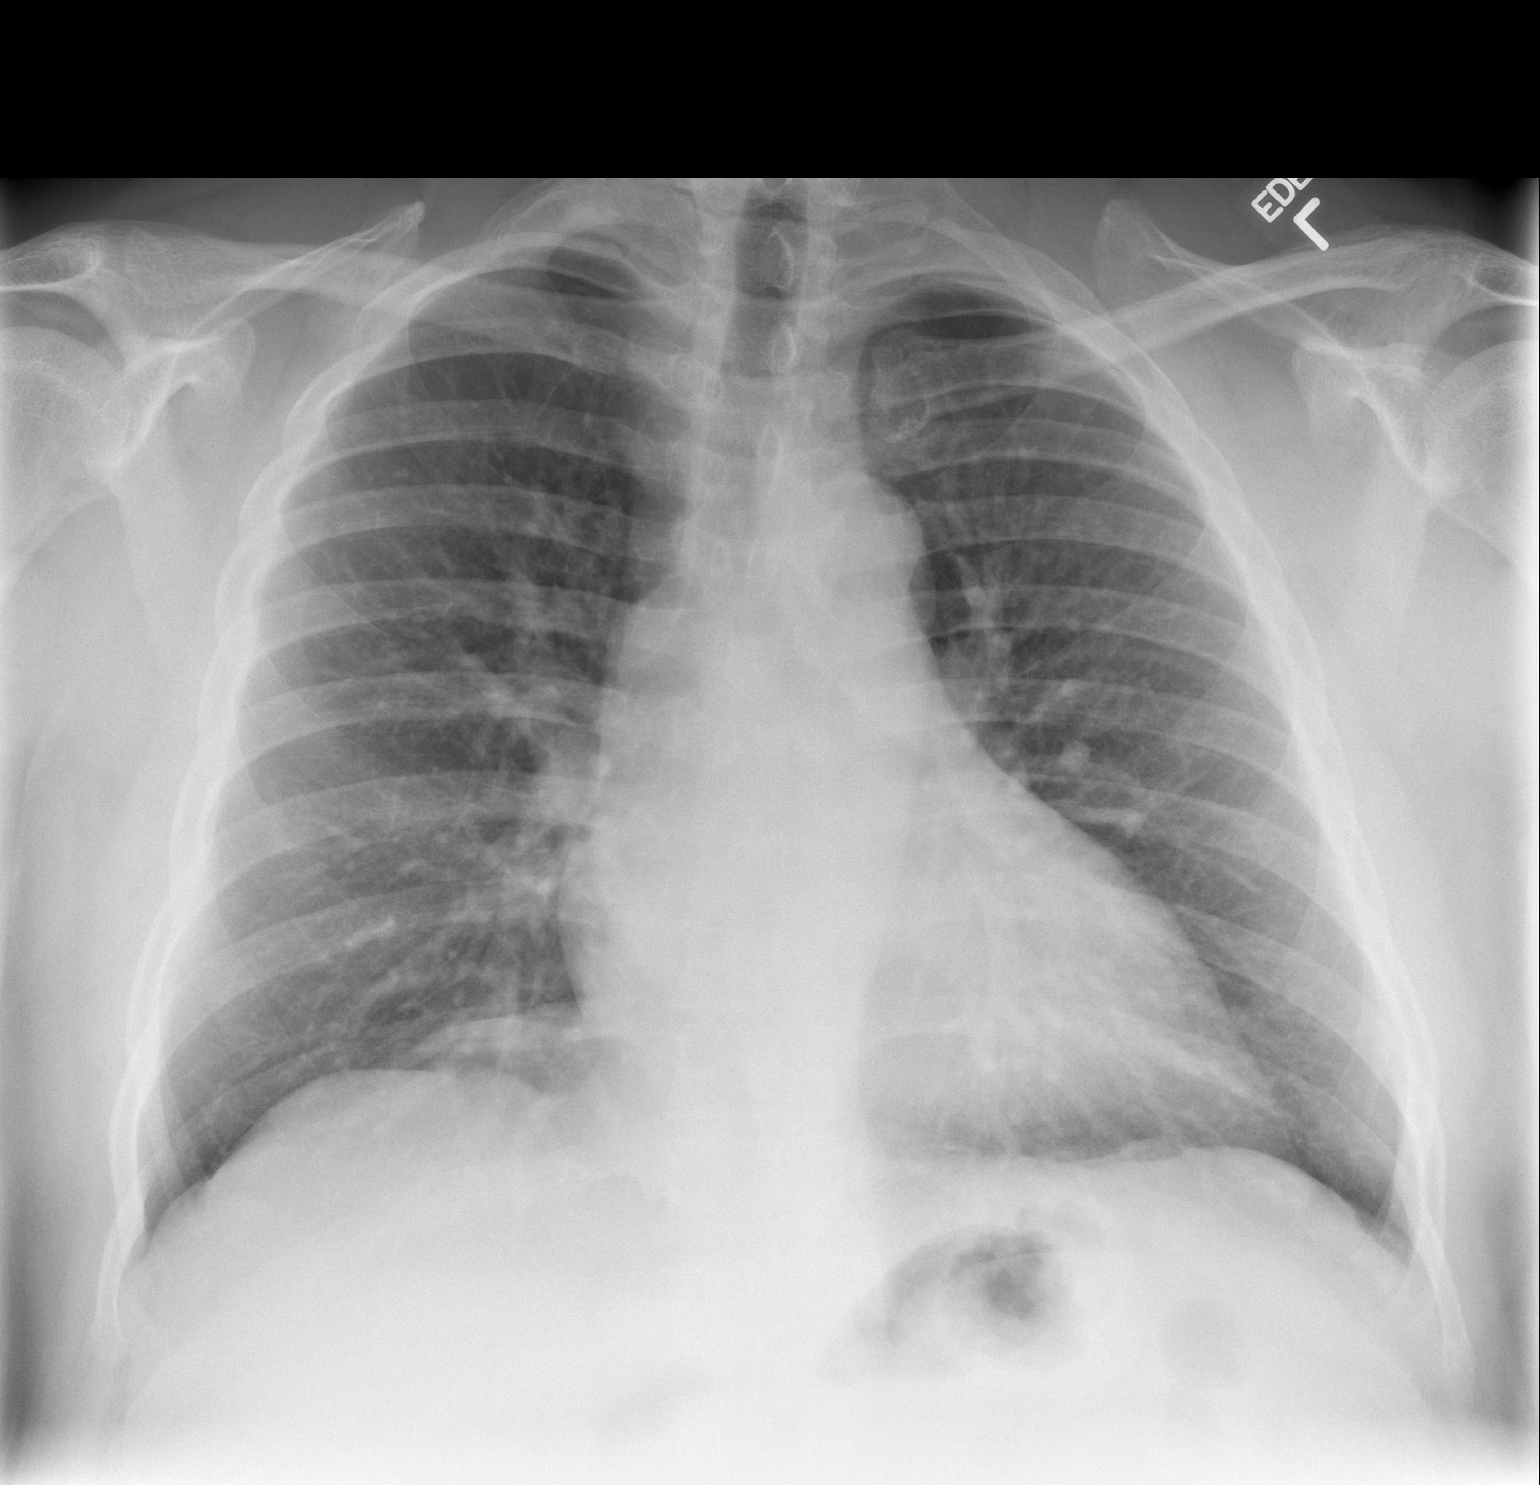

[w chest lat]
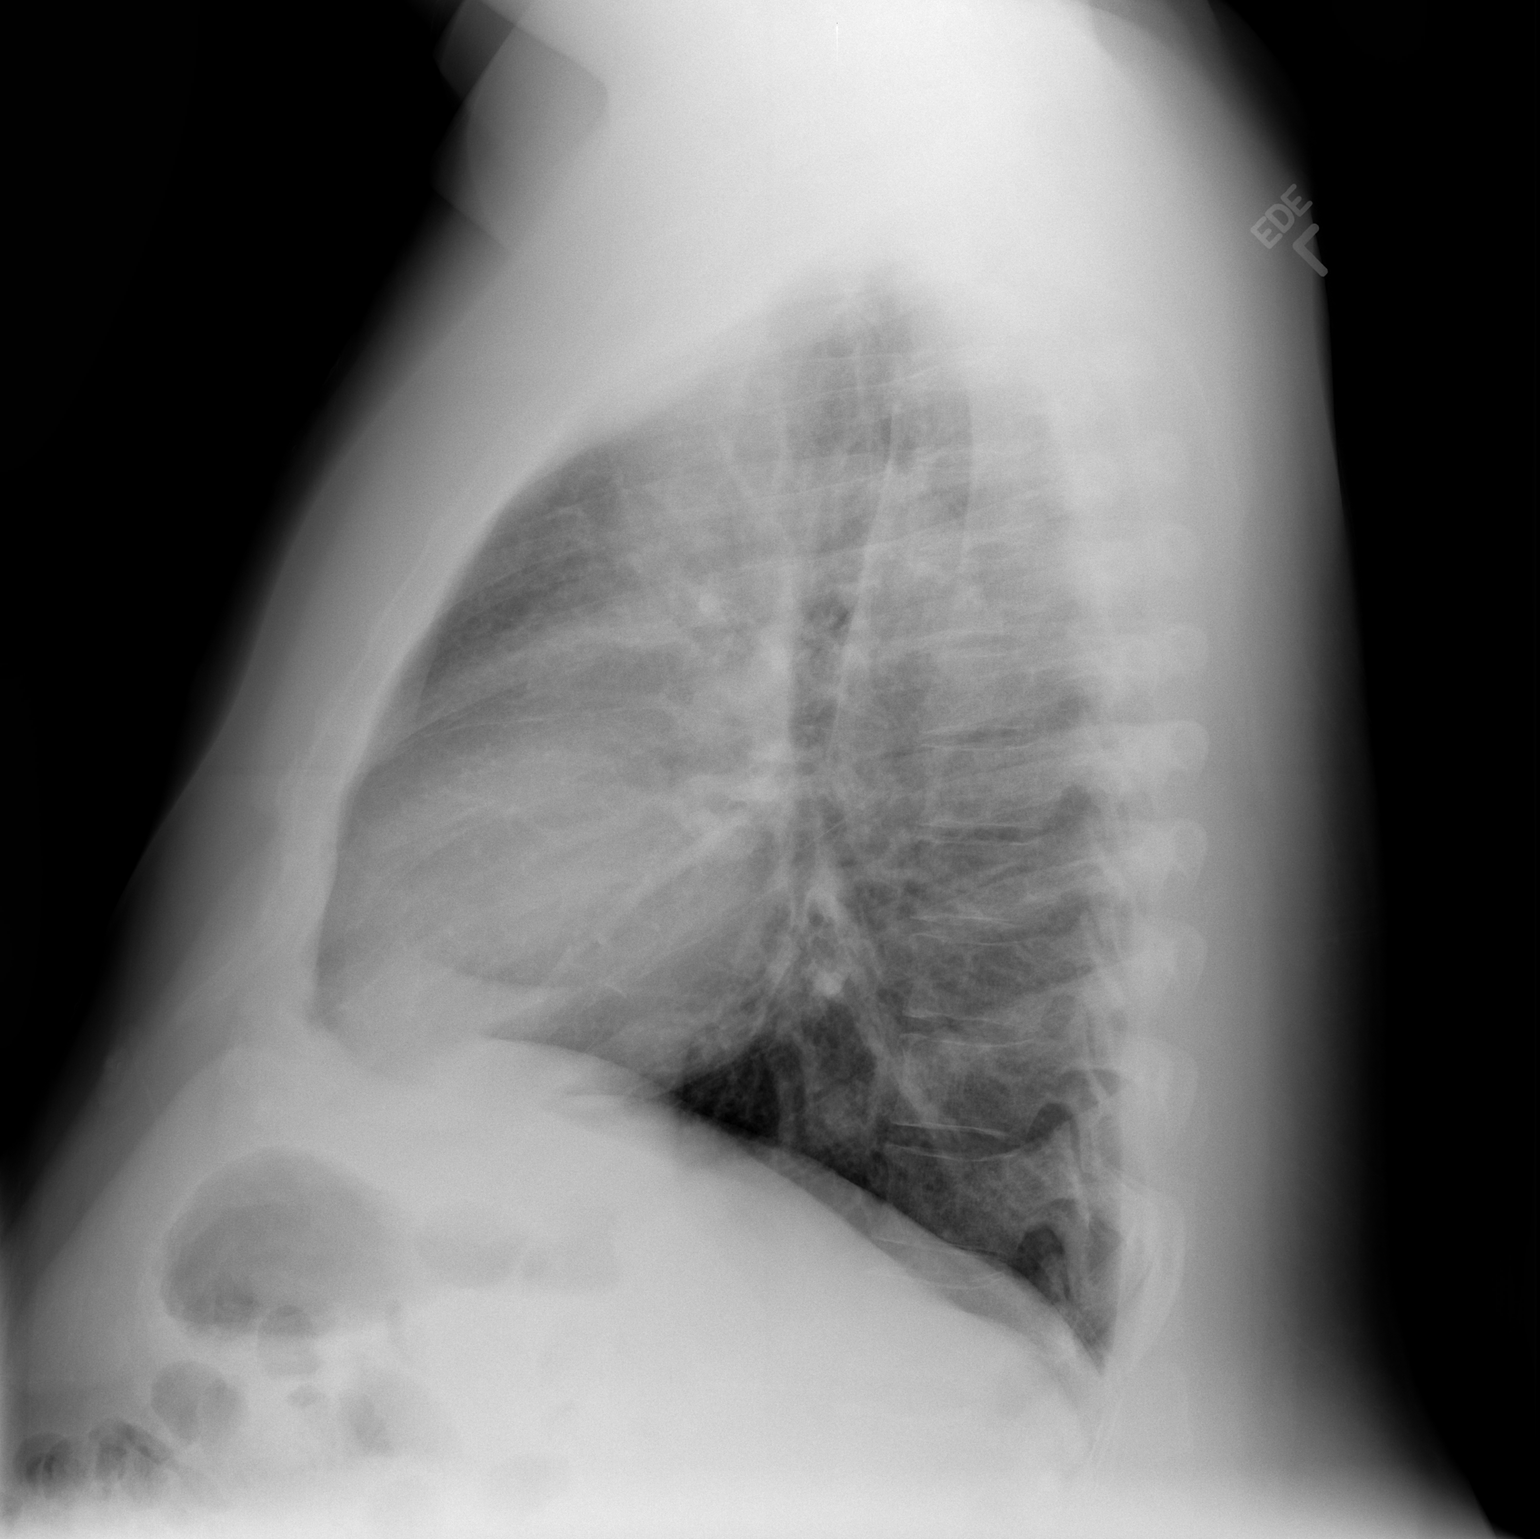

[2 of 2 positions shown; findings below may reference images not displayed]

FINDINGS: Heart size within normal limits.  The left ventricular
contour is somewhat prominent.  No congestive heart failure or
active pulmonary disease.  No pleural fluid or osseous lesions.
IMPRESSION: No active disease - the left ventricular contour is somewhat
prominent.

## 2011-08-03 ENCOUNTER — Other Ambulatory Visit: Payer: Self-pay | Admitting: Internal Medicine

## 2011-08-15 ENCOUNTER — Other Ambulatory Visit: Payer: Self-pay | Admitting: Internal Medicine

## 2011-09-27 ENCOUNTER — Ambulatory Visit (INDEPENDENT_AMBULATORY_CARE_PROVIDER_SITE_OTHER): Payer: BC Managed Care – PPO | Admitting: Internal Medicine

## 2011-09-27 ENCOUNTER — Encounter: Payer: Self-pay | Admitting: Internal Medicine

## 2011-09-27 ENCOUNTER — Other Ambulatory Visit (INDEPENDENT_AMBULATORY_CARE_PROVIDER_SITE_OTHER): Payer: BC Managed Care – PPO

## 2011-09-27 ENCOUNTER — Other Ambulatory Visit: Payer: Self-pay | Admitting: Internal Medicine

## 2011-09-27 DIAGNOSIS — E119 Type 2 diabetes mellitus without complications: Secondary | ICD-10-CM

## 2011-09-27 DIAGNOSIS — E785 Hyperlipidemia, unspecified: Secondary | ICD-10-CM

## 2011-09-27 DIAGNOSIS — I10 Essential (primary) hypertension: Secondary | ICD-10-CM

## 2011-09-27 LAB — LIPID PANEL
Cholesterol: 201 mg/dL — ABNORMAL HIGH (ref 0–200)
HDL: 44.7 mg/dL (ref >39.00)
Total CHOL/HDL Ratio: 4
Triglycerides: 131 mg/dL (ref 0.0–149.0)
VLDL: 26.2 mg/dL (ref 0.0–40.0)

## 2011-09-27 NOTE — Assessment & Plan Note (Signed)
Prev rec to start lipitor but declined due to concerns over potential side effects  Reviewed last lipids - LDL>150 - but refused med tx  consider alt statin (?crestor) if tx needed in future Check lipids now

## 2011-09-27 NOTE — Assessment & Plan Note (Signed)
Started metformin 04/2010 - Check a1c today - adjust meds if needed Lab Results  Component Value Date   HGBA1C 6.9* 06/21/2011

## 2011-09-27 NOTE — Assessment & Plan Note (Signed)
Difficult to control- initial dx 03/02/10 -  begun on hctz  then added ACEI 02/2010 - then added amlodipine 03/2010, inc dose 05/2010 changed to combo tribenzor 07/12/10;  tried tekturna but stopped when recalled-  added bystolic to tribenzor - tolerating reasonably well Continue efforts at lifestyle control   BP Readings from Last 3 Encounters:  09/27/11 140/84  06/21/11 150/78  02/08/11 146/90

## 2011-09-27 NOTE — Assessment & Plan Note (Signed)
Suggested consideration of lap band or other bariatric intervention - continue lifestyle control efforts with diet and exercise -   Wt Readings from Last 3 Encounters:  09/27/11 311 lb 1.9 oz (141.123 kg)  06/21/11 316 lb (143.337 kg)  02/08/11 303 lb 9.6 oz (137.712 kg)

## 2011-09-27 NOTE — Patient Instructions (Signed)
It was good to see you today. Medications reviewed, no changes at this time. Test(s) ordered today. Your results will be called to you after review (48-72hours after test completion). If any changes need to be made, you will be notified at that time. Continue to work on lifestyle changes as discussed (low fat, low carb, increased protein diet; improved exercise efforts; weight loss) to control sugar, blood pressure and cholesterol levels and/or reduce risk of developing other medical problems. Look into [email or web address] or other type of food journal to assist you in this process. Will need to consider Crestor or other cholesterol medications if LDL still high Please schedule followup in 4-6 months for labs/recheck, call sooner if problems.

## 2011-09-27 NOTE — Progress Notes (Signed)
  Subjective:    Patient ID: Jose Pacheco, male    DOB: 1956/01/15, 56 y.o.   MRN: [id number]  HPI  Here for follow up - reviewed chronic medical issues:  HTN - dx 03/02/10 -multiple medicine trials - remains difficult to control -reports 100% compliance with ongoing medical treatment and no changes in medication dose or frequency. denies adverse side effects related to current therapy. mild chronic edema ankles, no chest pain or other headache - no vision changes  Morbid obesity -started at 315.8# 02/2010 -denies weight gain and attributes slow but steady weight loss to diet and inc physcial activty  dyslipidemia - did not begin meds rx 03/2010 due to concerns over poss SE - working on low fat diet and cont weight loss -   DM2 - started metformin low dose for same 04/2010 - not monitoring cbgs but reports compliance with ongoing medical treatment and no changes in medication dose or frequency. denies adverse side effects related to current therapy.   Past Medical History  Diagnosis Date  . OBESITY, MORBID   . DIABETES MELLITUS, TYPE II   . ALLERGIC RHINITIS   . HYPERLIPIDEMIA   . HYPERTENSION      Review of Systems  Eyes: Negative for visual disturbance.  Respiratory: Negative for cough and shortness of breath.   Cardiovascular: Negative for chest pain.       Objective:   Physical Exam  BP 140/84  Pulse 58  Temp(Src) 98.1 F (36.7 C) (Oral)  Wt 311 lb 1.9 oz (141.123 kg)  SpO2 97% Wt Readings from Last 3 Encounters:  09/27/11 311 lb 1.9 oz (141.123 kg)  06/21/11 316 lb (143.337 kg)  02/08/11 303 lb 9.6 oz (137.712 kg)   Constitutional:  obese. appears well-developed and well-nourished. No distress.  Neck: Thick. Normal range of motion. Neck supple. No JVD present. No thyromegaly present.  Cardiovascular: Normal rate, regular rhythm and normal heart sounds.  No murmur heard. No BLE edema Pulmonary/Chest: Effort normal and breath sounds normal. No respiratory distress. no  wheezes.  Psychiatric: he has a normal mood and affect. behavior is normal. Judgment and thought content normal.   Lab Results  Component Value Date   WBC 8.6 03/02/2010   HGB 17.0 03/02/2010   HCT 50.0 03/02/2010   PLT 230 03/02/2010   CHOL 207* 02/08/2011   TRIG 83.0 02/08/2011   HDL 52.40 02/08/2011   LDLDIRECT 153.2 02/08/2011   NA 140 02/08/2011   K 4.2 02/08/2011   CL 100 02/08/2011   CREATININE 1.0 02/08/2011   BUN 18 02/08/2011   CO2 31 02/08/2011   TSH 1.49 02/08/2011   HGBA1C 6.9* 06/21/2011     Assessment & Plan:  See problem list. Medications and labs reviewed today.

## 2011-11-01 ENCOUNTER — Other Ambulatory Visit: Payer: Self-pay | Admitting: Internal Medicine

## 2012-01-27 ENCOUNTER — Other Ambulatory Visit: Payer: Self-pay | Admitting: Internal Medicine

## 2012-03-15 ENCOUNTER — Ambulatory Visit (INDEPENDENT_AMBULATORY_CARE_PROVIDER_SITE_OTHER): Payer: BC Managed Care – PPO | Admitting: Internal Medicine

## 2012-03-15 ENCOUNTER — Other Ambulatory Visit (INDEPENDENT_AMBULATORY_CARE_PROVIDER_SITE_OTHER): Payer: BC Managed Care – PPO

## 2012-03-15 ENCOUNTER — Encounter: Payer: Self-pay | Admitting: Internal Medicine

## 2012-03-15 VITALS — BP 142/84 | HR 64 | Temp 98.4°F | Ht 66.0 in | Wt 315.8 lb

## 2012-03-15 DIAGNOSIS — M545 Low back pain: Secondary | ICD-10-CM

## 2012-03-15 DIAGNOSIS — E119 Type 2 diabetes mellitus without complications: Secondary | ICD-10-CM

## 2012-03-15 DIAGNOSIS — I10 Essential (primary) hypertension: Secondary | ICD-10-CM

## 2012-03-15 LAB — HEMOGLOBIN A1C: Hgb A1c MFr Bld: 6.8 % — ABNORMAL HIGH (ref 4.6–6.5)

## 2012-03-15 MED ORDER — ASPIRIN EC 325 MG PO TBEC: 325.0000 mg | DELAYED_RELEASE_TABLET | Freq: Every day | ORAL | Status: AC

## 2012-03-15 NOTE — Assessment & Plan Note (Signed)
Started metformin 04/2010 - Check a1c today - adjust meds if needed Lab Results  Component Value Date   HGBA1C 6.2 09/27/2011

## 2012-03-15 NOTE — Progress Notes (Signed)
  Subjective:    Patient ID: Jose Pacheco, male    DOB: 07-29-1956, 56 y.o.   MRN: [id number]  HPI  Here for follow up - reviewed chronic medical issues:  HTN - dx 03/02/10 -multiple medicine trials - remains difficult to control -reports 100% compliance with ongoing medical treatment and no changes in medication dose or frequency. denies adverse side effects related to current therapy. mild chronic edema ankles, no chest pain or other headache - no vision changes  Morbid obesity -started at 315.8# 02/2010 -denies weight gain and attributes slow but steady weight loss to diet and inc physcial activty  dyslipidemia - did not begin meds rx 03/2010 due to concerns over poss SE - working on low fat diet and cont weight loss -   DM2 - started metformin low dose for same 04/2010 - not monitoring cbgs but reports compliance with ongoing medical treatment and no changes in medication dose or frequency. denies adverse side effects related to current therapy.   Past Medical History  Diagnosis Date  . OBESITY, MORBID   . DIABETES MELLITUS, TYPE II   . ALLERGIC RHINITIS   . HYPERLIPIDEMIA   . HYPERTENSION      Review of Systems  Eyes: Negative for visual disturbance.  Respiratory: Negative for cough and shortness of breath.   Cardiovascular: Negative for chest pain and leg swelling.  Musculoskeletal: Positive for back pain (right LBP since 11/2011 - no injury, no changes with position/activity).       Objective:   Physical Exam  BP 142/84  Pulse 64  Temp 98.4 F (36.9 C) (Oral)  Ht 5\' 6"  (1.676 m)  Wt 315 lb 12.8 oz (143.246 kg)  BMI 50.97 kg/m2  SpO2 97% Wt Readings from Last 3 Encounters:  03/15/12 315 lb 12.8 oz (143.246 kg)  09/27/11 311 lb 1.9 oz (141.123 kg)  06/21/11 316 lb (143.337 kg)   Constitutional:  obese. appears well-developed and well-nourished. No distress.  Neck: Thick. Normal range of motion. Neck supple. No JVD present. No thyromegaly present.  Cardiovascular:  Normal rate, regular rhythm and normal heart sounds.  No murmur heard. No BLE edema Pulmonary/Chest: Effort normal and breath sounds normal. No respiratory distress. no wheezes.  MSkel: Back: full range of motion of thoracic and lumbar spine. Non tender to palpation. Negative straight leg raise. DTR's are symmetrically intact. Sensation intact in all dermatomes of the lower extremities. Full strength to manual muscle testing. patient is able to heel toe walk without difficulty and ambulates with antalgic gait. Psychiatric: he has a normal mood and affect. behavior is normal. Judgment and thought content normal.   Lab Results  Component Value Date   WBC 8.6 03/02/2010   HGB 17.0 03/02/2010   HCT 50.0 03/02/2010   PLT 230 03/02/2010   CHOL 201* 09/27/2011   TRIG 131.0 09/27/2011   HDL 44.70 09/27/2011   LDLDIRECT 129.1 09/27/2011   NA 140 02/08/2011   K 4.2 02/08/2011   CL 100 02/08/2011   CREATININE 1.0 02/08/2011   BUN 18 02/08/2011   CO2 31 02/08/2011   TSH 1.49 02/08/2011   HGBA1C 6.2 09/27/2011     Assessment & Plan:  See problem list. Medications and labs reviewed today.  low back pain - strain most likely as much improved following ASA tx in past few days, exam benign.  Exercised and education provided - pt will call if worse

## 2012-03-15 NOTE — Assessment & Plan Note (Signed)
Again suggested consideration of lap band or other bariatric intervention - The patient is asked to make an attempt to improve diet and exercise patterns to aid in medical management of this problem.   Wt Readings from Last 3 Encounters:  03/15/12 315 lb 12.8 oz (143.246 kg)  09/27/11 311 lb 1.9 oz (141.123 kg)  06/21/11 316 lb (143.337 kg)

## 2012-03-15 NOTE — Patient Instructions (Addendum)
It was good to see you today. Medications reviewed, no changes at this time. Use Asprin daily for next 2-3 days to help with back pain as discussed  Test(s) ordered today. Your results will be called to you after review (48-72hours after test completion). If any changes need to be made, you will be notified at that time. Continue to work on lifestyle changes as discussed (low fat, low carb, increased protein diet; improved exercise efforts; weight loss) to control sugar, blood pressure and cholesterol levels and/or reduce risk of developing other medical problems. Look into [email or web address].[email or web address] or other type of food journal to assist you in this process. Please schedule followup in 4-6 months for labs/recheck, call sooner if problems. Back Exercises Back exercises help treat and prevent back injuries. The goal is to increase your strength in your belly (abdominal) and back muscles. These exercises can also help with flexibility. Start these exercises when told by your doctor. HOME CARE Back exercises include: Pelvic Tilt.  Lie on your back with your knees bent. Tilt your pelvis until the lower part of your back is against the floor. Hold this position 5 to 10 sec. Repeat this exercise 5 to 10 times.  Knee to Chest.  Pull 1 knee up against your chest and hold for 20 to 30 seconds. Repeat this with the other knee. This may be done with the other leg straight or bent, whichever feels better. Then, pull both knees up against your chest.  Sit-Ups or Curl-Ups.  Bend your knees 90 degrees. Start with tilting your pelvis, and do a partial, slow sit-up. Only lift your upper half 30 to 45 degrees off the floor. Take at least 2 to 3 seonds for each sit-up. Do not do sit-ups with your knees out straight. If partial sit-ups are difficult, simply do the above but with only tightening your belly (abdominal) muscles and holding it as told.  Hip-Lift.  Lie on your back with your knees flexed 90 degrees. Push down  with your feet and shoulders as you raise your hips 2 inches off the floor. Hold for 10 seconds, repeat 5 to 10 times.  Back Arches.  Lie on your stomach. Prop yourself up on bent elbows. Slowly press on your hands, causing an arch in your low back. Repeat 3 to 5 times.  Shoulder-Lifts.  Lie face down with arms beside your body. Keep hips and belly pressed to floor as you slowly lift your head and shoulders off the floor.  Do not overdo your exercises. Be careful in the beginning. Exercises may cause you some mild back discomfort. If the pain lasts for more than 15 minutes, stop the exercises until you see your doctor. Improvement with exercise for back problems is slow.   Document Released: 10/07/2010 Document Revised: 08/24/2011 Document Reviewed: 10/07/2010 [facility] Patient Information 2012 [place], [place].

## 2012-03-15 NOTE — Assessment & Plan Note (Signed)
Difficult to control- initial dx 03/02/10 -  begun on hctz  then added ACEI 02/2010 - then added amlodipine 03/2010, inc dose 05/2010 changed to combo tribenzor 07/12/10;  tried tekturna but stopped when recalled in 2012-  added bystolic to tribenzor - tolerating reasonably well Continue efforts at lifestyle control   BP Readings from Last 3 Encounters:  03/15/12 142/84  09/27/11 140/84  06/21/11 150/78

## 2012-05-04 ENCOUNTER — Other Ambulatory Visit: Payer: Self-pay | Admitting: Internal Medicine

## 2012-07-29 ENCOUNTER — Other Ambulatory Visit: Payer: Self-pay | Admitting: Internal Medicine

## 2012-08-05 ENCOUNTER — Other Ambulatory Visit: Payer: Self-pay | Admitting: Internal Medicine

## 2012-10-02 ENCOUNTER — Other Ambulatory Visit (INDEPENDENT_AMBULATORY_CARE_PROVIDER_SITE_OTHER): Payer: BC Managed Care – PPO

## 2012-10-02 ENCOUNTER — Ambulatory Visit (INDEPENDENT_AMBULATORY_CARE_PROVIDER_SITE_OTHER): Payer: BC Managed Care – PPO | Admitting: Internal Medicine

## 2012-10-02 ENCOUNTER — Encounter: Payer: Self-pay | Admitting: Internal Medicine

## 2012-10-02 VITALS — BP 132/84 | HR 72 | Temp 98.3°F | Ht 70.0 in | Wt 309.0 lb

## 2012-10-02 DIAGNOSIS — E119 Type 2 diabetes mellitus without complications: Secondary | ICD-10-CM

## 2012-10-02 DIAGNOSIS — E785 Hyperlipidemia, unspecified: Secondary | ICD-10-CM

## 2012-10-02 DIAGNOSIS — Z0000 Encounter for general adult medical examination without abnormal findings: Secondary | ICD-10-CM

## 2012-10-02 DIAGNOSIS — Z1211 Encounter for screening for malignant neoplasm of colon: Secondary | ICD-10-CM

## 2012-10-02 DIAGNOSIS — I10 Essential (primary) hypertension: Secondary | ICD-10-CM

## 2012-10-02 DIAGNOSIS — Z23 Encounter for immunization: Secondary | ICD-10-CM

## 2012-10-02 LAB — HEPATIC FUNCTION PANEL
ALT: 20 U/L (ref 0–53)
AST: 18 U/L (ref 0–37)
Bilirubin, Direct: 0.1 mg/dL (ref 0.0–0.3)
Total Protein: 7 g/dL (ref 6.0–8.3)

## 2012-10-02 LAB — BASIC METABOLIC PANEL
BUN: 17 mg/dL (ref 6–23)
Calcium: 9.1 mg/dL (ref 8.4–10.5)
GFR: 64.28 mL/min (ref >60.00)
Potassium: 3.6 mEq/L (ref 3.5–5.1)

## 2012-10-02 LAB — TSH: TSH: 1.48 u[IU]/mL (ref 0.35–5.50)

## 2012-10-02 LAB — LIPID PANEL
Cholesterol: 196 mg/dL (ref 0–200)
HDL: 42.7 mg/dL (ref >39.00)
VLDL: 33.8 mg/dL (ref 0.0–40.0)

## 2012-10-02 LAB — CBC WITH DIFFERENTIAL/PLATELET
Basophils Absolute: 0 10*3/uL (ref 0.0–0.1)
Basophils Relative: 0.5 % (ref 0.0–3.0)
Eosinophils Absolute: 0.3 10*3/uL (ref 0.0–0.7)
MCHC: 33.6 g/dL (ref 30.0–36.0)
MCV: 81.2 fl (ref 78.0–100.0)
Monocytes Absolute: 0.5 10*3/uL (ref 0.1–1.0)
Neutrophils Relative %: 60.5 % (ref 43.0–77.0)
Platelets: 259 10*3/uL (ref 150.0–400.0)
RDW: 14.1 % (ref 11.5–14.6)

## 2012-10-02 LAB — PSA: PSA: 2.89 ng/mL (ref 0.10–4.00)

## 2012-10-02 LAB — URINALYSIS, ROUTINE W REFLEX MICROSCOPIC
Bilirubin Urine: NEGATIVE
Hgb urine dipstick: NEGATIVE
Nitrite: NEGATIVE
Total Protein, Urine: NEGATIVE

## 2012-10-02 NOTE — Progress Notes (Signed)
Subjective:    Patient ID: Jose Pacheco, male    DOB: 02/17/1956, 57 y.o.   MRN: [id number]  HPI patient is here today for annual physical. Patient feels well overall.  also reviewed chronic medical issues:  hypertension - dx 03/02/10 -multiple medicine trials - remains difficult to control -reports 100% compliance with ongoing medical treatment and no changes in medication dose or frequency. denies adverse side effects related to current therapy. mild chronic edema ankles, no chest pain or other headache - no vision changes  Morbid obesity -started at 315.8# 02/2010 -denies weight gain and attributes slow but steady weight loss to diet and inc physcial activty  dyslipidemia - did not begin meds rx 03/2010 due to concerns over poss side effects - working on low fat diet and cont weight loss -   DM2 - started metformin low dose for same 04/2010 - not monitoring cbgs but reports compliance with ongoing medical treatment and no changes in medication dose or frequency. denies adverse side effects related to current therapy.   Past Medical History  Diagnosis Date  . OBESITY, MORBID   . DIABETES MELLITUS, TYPE II   . ALLERGIC RHINITIS   . HYPERLIPIDEMIA   . HYPERTENSION    Family History  Problem Relation Age of Onset  . Alcohol abuse Other   . Hypertension Other     grandparent, other relative  . Diabetes Other     other relative  . Stroke Other     parent   History  Substance Use Topics  . Smoking status: Former Smoker    Quit date: 09/19/1983  . Smokeless tobacco: Not on file     Comment: Single- lives alone (bad break-up fall 2010) works Patent examiner at [employer]   . Alcohol Use: Yes     Comment: rare   Review of Systems  Eyes: Negative for visual disturbance.  Respiratory: Negative for cough and shortness of breath.   Cardiovascular: Negative for chest pain and leg swelling.  Musculoskeletal: Positive for back pain (right LBP since 11/2011 - no injury, no changes with  position/activity).  No other specific complaints in a complete review of systems (except as listed in HPI above).      Objective:   Physical Exam  BP 132/84  Pulse 72  Temp 98.3 F (36.8 C) (Oral)  Ht 5\' 10"  (1.778 m)  Wt 309 lb (140.161 kg)  BMI 44.34 kg/m2  SpO2 96% Wt Readings from Last 3 Encounters:  10/02/12 309 lb (140.161 kg)  03/15/12 315 lb 12.8 oz (143.246 kg)  09/27/11 311 lb 1.9 oz (141.123 kg)   Constitutional:  He is obese, but appears well-developed and well-nourished. No distress.  Neck: Normal range of motion. Neck supple. No JVD present. No thyromegaly present.  Cardiovascular: Normal rate, regular rhythm and normal heart sounds.  No murmur heard. no BLE edema Pulmonary/Chest: Effort normal and breath sounds normal. No respiratory distress. no wheezes.  Abdominal: soft non incarcerated hernia -L abdominal and umbilical, reduced, non tender -Soft. Bowel sounds are normal. Patient exhibits no distension. There is no tenderness. GU: deferred  Musculoskeletal: Normal range of motion. Patient exhibits no gross deformities.  Neurological: he is alert and oriented to person, place, and time. No cranial nerve deficit. Coordination normal.  Skin: large peducular skin tag LLQ -Skin is warm and dry.  No erythema or ulceration.  Psychiatric: he has a normal mood and affect. behavior is normal. Judgment and thought content normal.   Lab Results  Component Value Date   WBC 8.6 03/02/2010   HGB 17.0 03/02/2010   HCT 50.0 03/02/2010   PLT 230 03/02/2010   CHOL 201* 09/27/2011   TRIG 131.0 09/27/2011   HDL 44.70 09/27/2011   LDLDIRECT 129.1 09/27/2011   NA 140 02/08/2011   K 4.2 02/08/2011   CL 100 02/08/2011   CREATININE 1.0 02/08/2011   BUN 18 02/08/2011   CO2 31 02/08/2011   TSH 1.49 02/08/2011   HGBA1C 6.8* 03/15/2012     Assessment & Plan:  CPX/v70.0 - Patient has been counseled on age-appropriate routine health concerns for screening and prevention. These are reviewed and  up-to-date. Immunizations are up-to-date or declined. Labs ordered and reviewed.  Also see problem list. Medications and labs reviewed today.

## 2012-10-02 NOTE — Assessment & Plan Note (Signed)
Started metformin 04/2010 - On ARB, declines statin Check a1c today - adjust meds if needed Lab Results  Component Value Date   HGBA1C 6.8* 03/15/2012

## 2012-10-02 NOTE — Patient Instructions (Signed)
It was good to see you today. Health Maintenance reviewed - Tdap updated today -all other recommended immunizations and age-appropriate screenings are up-to-date or declined.  Test(s) ordered today. Your results will be released to MyChart (or called to you) after review, usually within 72hours after test completion. If any changes need to be made, you will be notified at that same time. we'll make referral for colonoscopy screening to be done summer 2014. Our office will contact you regarding appointment(s) once made. Continue to work on lifestyle changes as discussed (low fat, low carb, increased protein diet; improved exercise efforts; weight loss) to control sugar, blood pressure and cholesterol levels and/or reduce risk of developing other medical problems. Look into [email or web address].com.cy or other type of food journal to assist you in this process. Please schedule followup in 6 months for diabetes mellitus labs/recheck, call sooner if problems. Health Maintenance, Males A healthy lifestyle and preventative care can promote health and wellness.  Maintain regular health, dental, and eye exams.   Eat a healthy diet. Foods like vegetables, fruits, whole grains, low-fat dairy products, and lean protein foods contain the nutrients you need without too many calories. Decrease your intake of foods high in solid fats, added sugars, and salt. Get information about a proper diet from your caregiver, if necessary.   Regular physical exercise is one of the most important things you can do for your health. Most adults should get at least 150 minutes of moderate-intensity exercise (any activity that increases your heart rate and causes you to sweat) each week. In addition, most adults need muscle-strengthening exercises on 2 or more days a week.     Maintain a healthy weight. The body mass index (BMI) is a screening tool to identify possible weight problems. It provides an estimate of body fat based on height and  weight. Your caregiver can help determine your BMI, and can help you achieve or maintain a healthy weight. For adults 20 years and older:   A BMI below 18.5 is considered underweight.   A BMI of 18.5 to 24.9 is normal.   A BMI of 25 to 29.9 is considered overweight.   A BMI of 30 and above is considered obese.   Maintain normal blood lipids and cholesterol by exercising and minimizing your intake of saturated fat. Eat a balanced diet with plenty of fruits and vegetables. Blood tests for lipids and cholesterol should begin at age 62 and be repeated every 5 years. If your lipid or cholesterol levels are high, you are over 50, or you are a high risk for heart disease, you may need your cholesterol levels checked more frequently. Ongoing high lipid and cholesterol levels should be treated with medicines, if diet and exercise are not effective.   If you smoke, find out from your caregiver how to quit. If you do not use tobacco, do not start.   If you choose to drink alcohol, do not exceed 2 drinks per day. One drink is considered to be 12 ounces (355 mL) of beer, 5 ounces (148 mL) of wine, or 1.5 ounces (44 mL) of liquor.   Avoid use of street drugs. Do not share needles with anyone. Ask for help if you need support or instructions about stopping the use of drugs.   High blood pressure causes heart disease and increases the risk of stroke. Blood pressure should be checked at least every 1 to 2 years. Ongoing high blood pressure should be treated with medicines if weight loss and exercise  are not effective.   If you are 67 to 56 years old, ask your caregiver if you should take aspirin to prevent heart disease.   Diabetes screening involves taking a blood sample to check your fasting blood sugar level. This should be done once every 3 years, after age 98, if you are within normal weight and without risk factors for diabetes. Testing should be considered at a younger age or be carried out more  frequently if you are overweight and have at least 1 risk factor for diabetes.   Colorectal cancer can be detected and often prevented. Most routine colorectal cancer screening begins at the age of 28 and continues through age 75. However, your caregiver may recommend screening at an earlier age if you have risk factors for colon cancer. On a yearly basis, your caregiver may provide home test kits to check for hidden blood in the stool. Use of a small camera at the end of a tube, to directly examine the colon (sigmoidoscopy or colonoscopy), can detect the earliest forms of colorectal cancer. Talk to your caregiver about this at age 8, when routine screening begins. Direct examination of the colon should be repeated every 5 to 10 years through age 54, unless early forms of pre-cancerous polyps or small growths are found.   Hepatitis C blood testing is recommended for all people born from [birth year] through 1964 and any individual with known risks for hepatitis C.   Healthy men should no longer receive prostate-specific antigen (PSA) blood tests as part of routine cancer screening. Consult with your caregiver about prostate cancer screening.   Testicular cancer screening is not recommended for adolescents or adult males who have no symptoms. Screening includes self-exam, caregiver exam, and other screening tests. Consult with your caregiver about any symptoms you have or any concerns you have about testicular cancer.   Practice safe sex. Use condoms and avoid high-risk sexual practices to reduce the spread of sexually transmitted infections (STIs).   Use sunscreen with a sun protection factor (SPF) of 30 or greater. Apply sunscreen liberally and repeatedly throughout the day. You should seek shade when your shadow is shorter than you. Protect yourself by wearing long sleeves, pants, a wide-brimmed hat, and sunglasses year round, whenever you are outdoors.   Notify your caregiver of new moles or changes in  moles, especially if there is a change in shape or color. Also notify your caregiver if a mole is larger than the size of a pencil eraser.   A one-time screening for abdominal aortic aneurysm (AAA) and surgical repair of large AAAs by sound wave imaging (ultrasonography) is recommended for ages 53 to 64 years who are current or former smokers.   Stay current with your immunizations.  Document Released: 03/02/2008 Document Revised: 11/27/2011 Document Reviewed: 01/30/2011 [facility] Patient Information 2013 [place], [place].

## 2012-10-02 NOTE — Assessment & Plan Note (Signed)
Difficult to control- initial dx 03/02/10 -  begun on hctz  then added ACEI 02/2010 - then added amlodipine 03/2010, inc dose 05/2010 changed to combo tribenzor 07/12/10;  tried tekturna but stopped when recalled in 2012-  added bystolic to tribenzor - tolerating reasonably well Continue efforts at lifestyle control   BP Readings from Last 3 Encounters:  10/02/12 132/84  03/15/12 142/84  09/27/11 140/84

## 2012-10-02 NOTE — Assessment & Plan Note (Signed)
Again suggested consideration of lap band or other bariatric intervention - The patient is asked to make an attempt to improve diet and exercise patterns to aid in medical management of this problem.   Wt Readings from Last 3 Encounters:  10/02/12 309 lb (140.161 kg)  03/15/12 315 lb 12.8 oz (143.246 kg)  09/27/11 311 lb 1.9 oz (141.123 kg)

## 2012-10-02 NOTE — Assessment & Plan Note (Signed)
Prev rec to start lipitor but declined due to concerns over potential side effects  Reviewed last lipids - LDL>150 - but refuses med tx - "intolerance"

## 2012-10-29 ENCOUNTER — Other Ambulatory Visit: Payer: Self-pay | Admitting: Internal Medicine

## 2012-11-06 ENCOUNTER — Other Ambulatory Visit: Payer: Self-pay | Admitting: Internal Medicine

## 2013-03-10 ENCOUNTER — Other Ambulatory Visit: Payer: Self-pay | Admitting: Internal Medicine

## 2013-04-14 ENCOUNTER — Encounter: Payer: Self-pay | Admitting: Internal Medicine

## 2013-04-14 ENCOUNTER — Ambulatory Visit (INDEPENDENT_AMBULATORY_CARE_PROVIDER_SITE_OTHER): Payer: BC Managed Care – PPO | Admitting: Internal Medicine

## 2013-04-14 ENCOUNTER — Other Ambulatory Visit (INDEPENDENT_AMBULATORY_CARE_PROVIDER_SITE_OTHER): Payer: BC Managed Care – PPO

## 2013-04-14 VITALS — BP 140/82 | HR 59 | Temp 98.4°F | Wt 327.0 lb

## 2013-04-14 DIAGNOSIS — E119 Type 2 diabetes mellitus without complications: Secondary | ICD-10-CM

## 2013-04-14 DIAGNOSIS — I10 Essential (primary) hypertension: Secondary | ICD-10-CM

## 2013-04-14 LAB — MICROALBUMIN / CREATININE URINE RATIO
Creatinine,U: 85.5 mg/dL
Microalb, Ur: 0.4 mg/dL (ref 0.0–1.9)

## 2013-04-14 NOTE — Assessment & Plan Note (Signed)
Started metformin 04/2010 - On ARB, declines statin Check a1c and microalb today - adjust meds if needed Weight trends reviewed  Lab Results  Component Value Date   HGBA1C 6.3 10/02/2012

## 2013-04-14 NOTE — Assessment & Plan Note (Signed)
Weight trends reviewed Previously suggested consideration of lap band or other bariatric intervention - The patient is asked to make an attempt to improve diet and exercise patterns to aid in medical management of this problem.   Wt Readings from Last 3 Encounters:  04/14/13 327 lb (148.326 kg)  10/02/12 309 lb (140.161 kg)  03/15/12 315 lb 12.8 oz (143.246 kg)

## 2013-04-14 NOTE — Progress Notes (Signed)
Subjective:    Patient ID: Jose Pacheco, male    DOB: 1956-06-24, 57 y.o.   MRN: [id number]  HPI Patient presents today for follow up on his chronic medical conditions. Today patient states he feels well, and he has had no changes over the past 6 months since his last visit.  Type II DM- Patient is currently on metformin which he is tolerating well. He does not check his glucose at home, but he has not had any symptoms such as polyuria or polydipsia.  Hypertension- He has not had any symptoms of elevated BP such as headaches or vision changes, and he does not check his blood pressure at home. He is compliant with his current medications- bystolic and tribenzor , and does not have any ADRs. Today, his BP is slightly elevated at 140/82, but he claims he just drove back from [place] yesterday, and he has had little sleep.  Obesity- He has gained about 15 pounds since January, although he claims he has been exercising more this summer. He claims he eats a low carb, high protein diet during the week, but he admits to not eating well over the weekends.  Past Medical History  Diagnosis Date  . OBESITY, MORBID   . DIABETES MELLITUS, TYPE II   . ALLERGIC RHINITIS   . HYPERLIPIDEMIA   . HYPERTENSION     Review of Systems  Constitutional: Negative for fatigue and unexpected weight change.  Respiratory: Negative for cough and shortness of breath.   Cardiovascular: Negative for chest pain.       Mild chronic edema of ankles  Skin: Negative for rash.  Neurological: Negative for dizziness.  Otherwise negative or as stated in HPI.      Objective:   Physical Exam  Constitutional: He is oriented to person, place, and time. He appears well-developed and well-nourished.  Obese male  Neck: Normal range of motion. Neck supple. No thyromegaly present.  Cardiovascular: Normal rate and regular rhythm.  Exam reveals no gallop and no friction rub.   No murmur heard. Pulmonary/Chest: Breath sounds  normal. He has no wheezes. He has no rales.  Musculoskeletal:  Mild non-pitting edema of ankles noted bilaterally  Neurological: He is alert and oriented to person, place, and time.  Skin: Skin is warm and dry.  Psychiatric: He has a normal mood and affect.   Filed Weights   04/14/13 1015  Weight: 327 lb (148.326 kg)   BP 150/82  Pulse 59  Temp(Src) 98.4 F (36.9 C) (Oral)  Wt 327 lb (148.326 kg)  BMI 46.92 kg/m2  SpO2 93%  Lab Results  Component Value Date   WBC 7.5 10/02/2012   HGB 15.5 10/02/2012   HCT 46.1 10/02/2012   PLT 259.0 10/02/2012   GLUCOSE 113* 10/02/2012   CHOL 196 10/02/2012   TRIG 169.0* 10/02/2012   HDL 42.70 10/02/2012   LDLDIRECT 129.1 09/27/2011   LDLCALC 120* 10/02/2012   ALT 20 10/02/2012   AST 18 10/02/2012   NA 139 10/02/2012   K 3.6 10/02/2012   CL 104 10/02/2012   CREATININE 1.2 10/02/2012   BUN 17 10/02/2012   CO2 29 10/02/2012   TSH 1.48 10/02/2012   PSA 2.89 10/02/2012   HGBA1C 6.3 10/02/2012      Assessment & Plan:  1. Type II DM- Will obtain A1C and microalb today to assess current control. Patient will continue metformin and diet and exercise. Follow up in 6 months to assess continued control.  2. Hypertension- Continue  with current regimen of bystolic and tribenzor.   3. Obesity- Continued diet and aerobic exercise was encouraged with attention to maintaining proper diet on weekends.  Concha Se, [place] Neighbor  I have personally reviewed this case with PA student. I also personally examined this patient. I agree with history and findings as documented above. I reviewed, discussed and approve of the assessment and plan as listed above. Rene Paci, MD

## 2013-04-14 NOTE — Patient Instructions (Signed)
It was good to see you today. Medications reviewed and updated, no changes at this time. Test(s) ordered today. Your results will be released to MyChart (or called to you) after review, usually within 72hours after test completion. If any changes need to be made, you will be notified at that same time. Continue to work on lifestyle changes as discussed (low fat, low carb, increased protein diet; improved exercise efforts; weight loss) to control sugar, blood pressure and cholesterol levels and/or reduce risk of developing other medical problems. Look into [email or web address].com.cy or other type of food journal to assist you in this process. Please schedule followup in 6 months for labs/recheck, call sooner if problems.  Exercise to Lose Weight Exercise and a healthy diet may help you lose weight. Your doctor may suggest specific exercises. EXERCISE IDEAS AND TIPS  Choose low-cost things you enjoy doing, such as walking, bicycling, or exercising to workout videos.  Take stairs instead of the elevator.  Walk during your lunch break.  Park your car further away from work or school.  Go to a gym or an exercise class.  Start with 5 to 10 minutes of exercise each day. Build up to 30 minutes of exercise 4 to 6 days a week.  Wear shoes with good support and comfortable clothes.  Stretch before and after working out.  Work out until you breathe harder and your heart beats faster.  Drink extra water when you exercise.  Do not do so much that you hurt yourself, feel dizzy, or get very short of breath. Exercises that burn about 150 calories:  Running 1  miles in 15 minutes.  Playing volleyball for 45 to 60 minutes.  Washing and waxing a car for 45 to 60 minutes.  Playing touch football for 45 minutes.  Walking 1  miles in 35 minutes.  Pushing a stroller 1  miles in 30 minutes.  Playing basketball for 30 minutes.  Raking leaves for 30 minutes.  Bicycling 5 miles in 30  minutes.  Walking 2 miles in 30 minutes.  Dancing for 30 minutes.  Shoveling snow for 15 minutes.  Swimming laps for 20 minutes.  Walking up stairs for 15 minutes.  Bicycling 4 miles in 15 minutes.  Gardening for 30 to 45 minutes.  Jumping rope for 15 minutes.  Washing windows or floors for 45 to 60 minutes. Document Released: 10/07/2010 Document Revised: 11/27/2011 Document Reviewed: 10/07/2010 [facility] Patient Information 2014 [place], [place].

## 2013-04-14 NOTE — Assessment & Plan Note (Signed)
Difficult to control- initial dx 03/02/10 -  begun on hctz  then added ACEI 02/2010 - then added amlodipine 03/2010, inc dose 05/2010 changed to combo tribenzor 07/12/10;  tried tekturna but stopped when recalled in 2012-  added bystolic to tribenzor - tolerating reasonably well Continue efforts at lifestyle control   BP Readings from Last 3 Encounters:  04/14/13 150/82  10/02/12 132/84  03/15/12 142/84

## 2013-04-28 ENCOUNTER — Other Ambulatory Visit: Payer: Self-pay | Admitting: Internal Medicine

## 2013-05-12 ENCOUNTER — Other Ambulatory Visit: Payer: Self-pay | Admitting: Internal Medicine

## 2013-08-06 ENCOUNTER — Other Ambulatory Visit: Payer: Self-pay | Admitting: Internal Medicine

## 2013-09-12 ENCOUNTER — Telehealth: Payer: Self-pay | Admitting: Internal Medicine

## 2013-09-12 MED ORDER — NEBIVOLOL HCL 10 MG PO TABS: 20.0000 mg | ORAL_TABLET | Freq: Every day | ORAL | Status: DC

## 2013-09-12 NOTE — Telephone Encounter (Signed)
Pt called stated that [facility] does not make 20 mg By[facility]olic anymore, pt was told to contact PCP to get Bystolic 10 mg 2 tab(to make it 20 mg) [facility] instead. Please advise.

## 2013-09-12 NOTE — Telephone Encounter (Signed)
Ok done

## 2013-09-12 NOTE — Telephone Encounter (Signed)
Called pt no answer LMOM [facility].../lmb 

## 2013-10-03 ENCOUNTER — Encounter: Payer: Self-pay | Admitting: Internal Medicine

## 2013-10-03 ENCOUNTER — Ambulatory Visit (INDEPENDENT_AMBULATORY_CARE_PROVIDER_SITE_OTHER): Payer: BC Managed Care – PPO | Admitting: Internal Medicine

## 2013-10-03 ENCOUNTER — Other Ambulatory Visit (INDEPENDENT_AMBULATORY_CARE_PROVIDER_SITE_OTHER): Payer: BC Managed Care – PPO

## 2013-10-03 VITALS — BP 144/98 | HR 64 | Temp 98.2°F | Wt 342.8 lb

## 2013-10-03 DIAGNOSIS — I10 Essential (primary) hypertension: Secondary | ICD-10-CM

## 2013-10-03 DIAGNOSIS — E785 Hyperlipidemia, unspecified: Secondary | ICD-10-CM

## 2013-10-03 DIAGNOSIS — E119 Type 2 diabetes mellitus without complications: Secondary | ICD-10-CM

## 2013-10-03 DIAGNOSIS — Z1211 Encounter for screening for malignant neoplasm of colon: Secondary | ICD-10-CM

## 2013-10-03 DIAGNOSIS — Z0000 Encounter for general adult medical examination without abnormal findings: Secondary | ICD-10-CM

## 2013-10-03 LAB — CBC WITH DIFFERENTIAL/PLATELET
BASOS PCT: 0.4 % (ref 0.0–3.0)
Basophils Absolute: 0 10*3/uL (ref 0.0–0.1)
EOS ABS: 0.4 10*3/uL (ref 0.0–0.7)
Eosinophils Relative: 5.4 % — ABNORMAL HIGH (ref 0.0–5.0)
HCT: 45.4 % (ref 39.0–52.0)
HEMOGLOBIN: 15.3 g/dL (ref 13.0–17.0)
Lymphocytes Relative: 26.2 % (ref 12.0–46.0)
Lymphs Abs: 2.2 10*3/uL (ref 0.7–4.0)
MCHC: 33.7 g/dL (ref 30.0–36.0)
MCV: 80.7 fl (ref 78.0–100.0)
MONO ABS: 0.6 10*3/uL (ref 0.1–1.0)
Monocytes Relative: 7.4 % (ref 3.0–12.0)
NEUTROS ABS: 5 10*3/uL (ref 1.4–7.7)
Neutrophils Relative %: 60.6 % (ref 43.0–77.0)
Platelets: 265 10*3/uL (ref 150.0–400.0)
RBC: 5.63 Mil/uL (ref 4.22–5.81)
RDW: 13.8 % (ref 11.5–14.6)
WBC: 8.3 10*3/uL (ref 4.5–10.5)

## 2013-10-03 LAB — BASIC METABOLIC PANEL
BUN: 14 mg/dL (ref 6–23)
CALCIUM: 9 mg/dL (ref 8.4–10.5)
CHLORIDE: 104 meq/L (ref 96–112)
CO2: 29 mEq/L (ref 19–32)
CREATININE: 1.2 mg/dL (ref 0.4–1.5)
GFR: 67.16 mL/min (ref >60.00)
Glucose, Bld: 177 mg/dL — ABNORMAL HIGH (ref 70–99)
Potassium: 3.7 mEq/L (ref 3.5–5.1)
Sodium: 140 mEq/L (ref 135–145)

## 2013-10-03 LAB — URINALYSIS, ROUTINE W REFLEX MICROSCOPIC
BILIRUBIN URINE: NEGATIVE
HGB URINE DIPSTICK: NEGATIVE
Ketones, ur: NEGATIVE
Leukocytes, UA: NEGATIVE
Nitrite: NEGATIVE
PH: 6 (ref 5.0–8.0)
Specific Gravity, Urine: 1.02 (ref 1.000–1.030)
TOTAL PROTEIN, URINE-UPE24: NEGATIVE
Urine Glucose: NEGATIVE
Urobilinogen, UA: 0.2 (ref 0.0–1.0)

## 2013-10-03 LAB — HEPATIC FUNCTION PANEL
ALBUMIN: 3.6 g/dL (ref 3.5–5.2)
ALK PHOS: 67 U/L (ref 39–117)
ALT: 27 U/L (ref 0–53)
AST: 16 U/L (ref 0–37)
Bilirubin, Direct: 0.1 mg/dL (ref 0.0–0.3)
Total Bilirubin: 0.7 mg/dL (ref 0.3–1.2)
Total Protein: 7.1 g/dL (ref 6.0–8.3)

## 2013-10-03 LAB — LIPID PANEL
CHOLESTEROL: 202 mg/dL — AB (ref 0–200)
HDL: 36.9 mg/dL — ABNORMAL LOW (ref >39.00)
Total CHOL/HDL Ratio: 5
Triglycerides: 195 mg/dL — ABNORMAL HIGH (ref 0.0–149.0)
VLDL: 39 mg/dL (ref 0.0–40.0)

## 2013-10-03 LAB — HEMOGLOBIN A1C: Hgb A1c MFr Bld: 8.1 % — ABNORMAL HIGH (ref 4.6–6.5)

## 2013-10-03 LAB — TSH: TSH: 1.96 u[IU]/mL (ref 0.35–5.50)

## 2013-10-03 LAB — LDL CHOLESTEROL, DIRECT: LDL DIRECT: 144 mg/dL

## 2013-10-03 MED ORDER — OLMESARTAN-AMLODIPINE-HCTZ 40-10-25 MG PO TABS: 1.0000 | ORAL_TABLET | Freq: Every day | ORAL | Status: DC

## 2013-10-03 MED ORDER — METFORMIN HCL ER 500 MG PO TB24: 500.0000 mg | ORAL_TABLET | Freq: Every day | ORAL | Status: DC

## 2013-10-03 MED ORDER — SITAGLIPTIN PHOSPHATE 100 MG PO TABS: 100.0000 mg | ORAL_TABLET | Freq: Every day | ORAL | Status: DC

## 2013-10-03 NOTE — Addendum Note (Signed)
Addended by: Rene PaciLESCHBER, VALERIE A on: 10/03/2013 05:19 PM   Modules accepted: Orders

## 2013-10-03 NOTE — Assessment & Plan Note (Signed)
Started metformin 04/2010 - On ARB, declines statin Check a1c today - adjust meds if needed Weight trends reviewed The patient is asked to make an attempt to improve diet and exercise patterns to aid in medical management of this problem.  Lab Results  Component Value Date   HGBA1C 6.9* 04/14/2013

## 2013-10-03 NOTE — Progress Notes (Signed)
Subjective:    Patient ID: Jose Pacheco, male    DOB: 04/03/1956, 58 y.o.   MRN: [id number]  HPI   patient is here today for annual physical. Patient feels well and has no complaints.  Also reviewed chronic medical issues and interval medical events  Type II DM- Patient is currently on metformin which he is tolerating well. He does not check his glucose at home, but he has not had any symptoms such as polyuria or polydipsia.  Hypertension- He has not had any symptoms of elevated BP such as headaches or vision changes, and he does not check his blood pressure at home. He is compliant with his current medications- bystolic and tribenzor , and does not have any ADRs. Today, his BP is slightly elevated, but he reports he just drove back from [place] yesterday, and he has had little sleep.  Obesity- continued weight gain reviewed, but claims he has been exercising more. reports he eats a low carb, high protein diet during the week, but he admits to not eating well over the weekends.  Past Medical History  Diagnosis Date  . OBESITY, MORBID   . DIABETES MELLITUS, TYPE II   . ALLERGIC RHINITIS   . HYPERLIPIDEMIA   . HYPERTENSION    Family History  Problem Relation Age of Onset  . Alcohol abuse Other   . Hypertension Other     grandparent, other relative  . Diabetes Other     other relative  . Stroke Other     parent   History  Substance Use Topics  . Smoking status: Former Smoker    Quit date: 09/19/1983  . 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 Alcohol Use: Yes     Comment: rare    Review of Systems  Constitutional: Negative for fever, activity change, appetite change, fatigue and unexpected weight change.  Respiratory: Negative for cough, chest tightness, shortness of breath and wheezing.   Cardiovascular: Negative for chest pain and palpitations.       Mild chronic edema of ankles  Skin:  Negative for rash.  Neurological: Negative for dizziness, weakness and headaches.  Psychiatric/Behavioral: Negative for dysphoric mood. The patient is not nervous/anxious.   All other systems reviewed and are negative.       Objective:   Physical Exam  BP 144/98  Pulse 64  Temp(Src) 98.2 F (36.8 C) (Oral)  Wt 342 lb 12.8 oz (155.493 kg)  SpO2 94%  Wt Readings from Last 3 Encounters:  10/03/13 342 lb 12.8 oz (155.493 kg)  04/14/13 327 lb (148.326 kg)  10/02/12 309 lb (140.161 kg)   Constitutional: he is MO, but appears well-developed and well-nourished. No distress.  HENT: Head: Normocephalic and atraumatic. Ears: B TMs ok, no erythema or effusion; Nose: Nose normal. Mouth/Throat: Oropharynx is clear and moist. No oropharyngeal exudate.  Eyes: Conjunctivae and EOM are normal. Pupils are equal, round, and reactive to light. No scleral icterus.  Neck: Normal range of motion. Neck supple. No JVD present. No thyromegaly present.  Cardiovascular: Normal rate, regular rhythm and normal heart sounds.  No murmur heard. chronic pitting 1+ BLE edema at ankles with fatty ankles. Pulmonary/Chest: Effort normal and breath sounds normal. No respiratory distress. he has no wheezes.  Abdominal: Soft. Bowel sounds are normal. he exhibits no distension. There is no tenderness. no masses Musculoskeletal: Normal range of motion, no joint effusions. No  gross deformities Neurological: he is alert and oriented to person, place, and time. No cranial nerve deficit. Coordination, balance, strength, speech and gait are normal.  Skin: Skin is warm and dry. No rash noted. No erythema.  Psychiatric: he has a normal mood and affect. behavior is normal. Judgment and thought content normal.  Lab Results  Component Value Date   WBC 7.5 10/02/2012   HGB 15.5 10/02/2012   HCT 46.1 10/02/2012   PLT 259.0 10/02/2012   GLUCOSE 113* 10/02/2012   CHOL 196 10/02/2012   TRIG 169.0* 10/02/2012   HDL 42.70 10/02/2012    LDLDIRECT 129.1 09/27/2011   LDLCALC 120* 10/02/2012   ALT 20 10/02/2012   AST 18 10/02/2012   NA 139 10/02/2012   K 3.6 10/02/2012   CL 104 10/02/2012   CREATININE 1.2 10/02/2012   BUN 17 10/02/2012   CO2 29 10/02/2012   TSH 1.48 10/02/2012   PSA 2.89 10/02/2012   HGBA1C 6.9* 04/14/2013   MICROALBUR 0.4 04/14/2013      Assessment & Plan:   CPX/v70.0 - Patient has been counseled on age-appropriate routine health concerns for screening and prevention. These are reviewed and up-to-date. Immunizations are up-to-date or declined. Labs ordered and reviewed. Refer for cologaurd screening  Also See problem list. Medications and labs reviewed today.

## 2013-10-03 NOTE — Assessment & Plan Note (Signed)
Difficult to control- initial dx 03/02/10 -  begun on hctz  then added ACEI 02/2010 - then added amlodipine 03/2010, inc dose 05/2010 changed to combo tribenzor 07/12/10;  tried tekturna but stopped when recalled in 2012-  added bystolic to tribenzor - tolerating reasonably well Continue efforts at lifestyle control   BP Readings from Last 3 Encounters:  10/03/13 144/98  04/14/13 140/82  10/02/12 132/84

## 2013-10-03 NOTE — Progress Notes (Signed)
Pre-visit discussion using our clinic review tool. No additional management support is needed unless otherwise documented below in the visit note.  

## 2013-10-03 NOTE — Assessment & Plan Note (Signed)
Prev rec to start lipitor but declined due to concerns over potential side effects  Reviewed last lipids - LDL>150 - but refuses med tx - "intolerance" reviewed

## 2013-10-03 NOTE — Patient Instructions (Addendum)
It was good to see you today.  We have reviewed your prior records including labs and tests today  Health Maintenance reviewed - all recommended immunizations and age-appropriate screenings are up-to-date or declined.  Test(s) ordered today. Your results will be released to [facility] (or called to you) after review, usually within 72hours after test completion. If any changes need to be made, you will be notified at that same time.  Medications reviewed and updated, no changes recommended at this time.  We'll have you screened for colon cancer with COLO[facility]URD (stool DNA testing) - this packet will be sent to your home by the testing company. Complete instructions as in the box, and we will contact you with the results once available.  Work on lifestyle changes as discussed (low fat, low carb, increased protein diet; improved exercise efforts; weight loss) to control sugar, blood pressure and cholesterol levels and/or reduce risk of developing other medical problems. Look into [email or web address].[email or web address] or other type of food journal to assist you in this process.  Please schedule followup in 6 months for diabetes check, weight check and labs, call sooner if problems.  Health Maintenance, Males A healthy lifestyle and preventative care can promote health and wellness.  Maintain regular health, dental, and eye exams.  Eat a healthy diet. Foods like vegetables, fruits, whole grains, low-fat dairy products, and lean protein foods contain the nutrients you need and are low in calories. Decrease your intake of foods high in solid fats, added sugars, and salt. Get information about a proper diet from your health care provider, if necessary.  Regular physical exercise is one of the most important things you can do for your health. Most adults should get at least 150 minutes of moderate-intensity exercise (any activity that increases your heart rate and causes you to sweat) each week. In addition, most adults need  muscle-strengthening exercises on 2 or more days a week.   Maintain a healthy weight. The body mass index (BMI) is a screening tool to identify possible weight problems. It provides an estimate of body fat based on height and weight. Your health care provider can find your BMI and can help you achieve or maintain a healthy weight. For males 20 years and older:  A BMI below 18.5 is considered underweight.  A BMI of 18.5 to 24.9 is normal.  A BMI of 25 to 29.9 is considered overweight.  A BMI of 30 and above is considered obese.  Maintain normal blood lipids and cholesterol by exercising and minimizing your intake of saturated fat. Eat a balanced diet with plenty of fruits and vegetables. Blood tests for lipids and cholesterol should begin at age 53 and be repeated every 5 years. If your lipid or cholesterol levels are high, you are over 50, or you are at high risk for heart disease, you may need your cholesterol levels checked more frequently.Ongoing high lipid and cholesterol levels should be treated with medicines, if diet and exercise are not working.  If you smoke, find out from your health care provider how to quit. If you do not use tobacco, do not start.  Lung cancer screening is recommended for adults aged 66 80 years who are at high risk for developing lung cancer because of a history of smoking. A yearly low-dose CT scan of the lungs is recommended for people who have at least a 30-pack-year history of smoking and are a current smoker or have quit within the past 15 years. A pack year of smoking is  smoking an average of 1 pack of cigarettes a day for 1 year (for example, a 30-pack-year history of smoking could mean smoking 1 pack a day for 30 years or 2 packs a day for 15 years). Yearly screening should continue until the smoker has stopped smoking for at least 15 years. Yearly screening should be stopped for people who develop a health problem that would prevent them from having lung  cancer treatment.  If you choose to drink alcohol, do not have more than 2 drinks per day. One drink is considered to be 12 oz (360 mL) of beer, 5 oz (150 mL) of wine, or 1.5 oz (45 mL) of liquor.  Avoid use of street drugs. Do not share needles with anyone. Ask for help if you need support or instructions about stopping the use of drugs.  High blood pressure causes heart disease and increases the risk of stroke. Blood pressure should be checked at least every 1 2 years. Ongoing high blood pressure should be treated with medicines if weight loss and exercise are not effective.  If you are 60 57 years old, ask your health care provider if you should take aspirin to prevent heart disease.  Diabetes screening involves taking a blood sample to check your fasting blood sugar level. This should be done once every 3 years after age 52, if you are at a normal weight and without risk factors for diabetes. Testing should be considered at a younger age or be carried out more frequently if you are overweight and have at least 1 risk factor for diabetes.  Colorectal cancer can be detected and often prevented. Most routine colorectal cancer screening begins at the age of 68 and continues through age 5. However, your health care provider may recommend screening at an earlier age if you have risk factors for colon cancer. On a yearly basis, your health care provider may provide home test kits to check for hidden blood in the stool. A small camera at the end of a tube may be used to directly examine the colon (sigmoidoscopy or colonoscopy) to detect the earliest forms of colorectal cancer. Talk to your health care provider about this at age 27, when routine screening begins. A direct exam of the colon should be repeated every 5 10 years through age 29, unless early forms of pre-cancerous polyps or small growths are found.  People who are at an increased risk for hepatitis B should be screened for this virus. You are  considered at high risk for hepatitis B if:  You were born in a country where hepatitis B occurs often. Talk with your health care provider about which countries are considered high-risk.  Your parents were born in a high-risk country and you have not received a shot to protect against hepatitis B (hepatitis B vaccine).  You have HIV or AIDS.  You use needles to inject street drugs.  You live with, or have sex with, someone who has hepatitis B.  You are a man who has sex with other men (MSM).  You get hemodialysis treatment.  You take certain medicines for conditions like cancer, organ transplantation, and autoimmune conditions.  Hepatitis C blood testing is recommended for all people born from [birth year] through 1964 and any individual with known risk factors for hepatitis C.  Healthy men should no longer receive prostate-specific antigen (PSA) blood tests as part of routine cancer screening. Talk to your health care provider about prostate cancer screening.  Testicular cancer screening is  not recommended for adolescents or adult males who have no symptoms. Screening includes self-exam, a health care provider exam, and other screening tests. Consult with your health care provider about any symptoms you have or any concerns you have about testicular cancer.  Practice safe sex. Use condoms and avoid high-risk sexual practices to reduce the spread of sexually transmitted infections (STIs).  Use sunscreen. Apply sunscreen liberally and repeatedly throughout the day. You should seek shade when your shadow is shorter than you. Protect yourself by wearing long sleeves, pants, a wide-brimmed hat, and sunglasses year round, whenever you are outdoors.  Tell your health care provider of new moles or changes in moles, especially if there is a change in shape or color. Also tell your provider if a mole is larger than the size of a pencil eraser.  A one-time screening for abdominal aortic aneurysm (AAA)  and surgical repair of large AAAs by ultrasound is recommended for men aged 33 75 years who are current or former smokers.  Stay current with your vaccines (immunizations). Document Released: 03/02/2008 Document Revised: 06/25/2013 Document Reviewed: 01/30/2011 [facility] Patient Information 2014 [place], [place]. Diabetes and Standards of Medical Care  Diabetes is complicated. You may find that your diabetes team includes a dietitian, nurse, diabetes educator, eye doctor, and more. To help everyone know what is going on and to help you get the care you deserve, the following schedule of care was developed to help keep you on track. Below are the tests, exams, vaccines, medicines, education, and plans you will need. HbA1c test This test shows how well you have controlled your glucose over the past 2 3 months. It is used to see if your diabetes management plan needs to be adjusted.   It is performed at least 2 times a year if you are meeting treatment goals.  It is performed 4 times a year if therapy has changed or if you are not meeting treatment goals. Blood pressure test  This test is performed at every routine medical visit. The goal is less than 140/90 mmHg for most people, but 130/80 mmHg in some cases. Ask your health care provider about your goal. Dental exam  Follow up with the dentist regularly. Eye exam  If you are diagnosed with type 1 diabetes as a child, get an exam upon reaching the age of 110 years or older and have had diabetes for 3 5 years. Yearly eye exams are recommended after that initial eye exam.  If you are diagnosed with type 1 diabetes as an adult, get an exam within 5 years of diagnosis and then yearly.  If you are diagnosed with type 2 diabetes, get an exam as soon as possible after the diagnosis and then yearly. Foot care exam  Visual foot exams are performed at every routine medical visit. The exams check for cuts, injuries, or other problems with the feet.  A  comprehensive foot exam should be done yearly. This includes visual inspection as well as assessing foot pulses and testing for loss of sensation.  Check your feet nightly for cuts, injuries, or other problems with your feet. Tell your health care provider if anything is not healing. Kidney function test (urine microalbumin)  This test is performed once a year.  Type 1 diabetes: The first test is performed 5 years after diagnosis.  Type 2 diabetes: The first test is performed at the time of diagnosis.  A serum creatinine and estimated glomerular filtration rate (eGFR) test is done once a  year to assess the level of chronic kidney disease (CKD), if present. Lipid profile (cholesterol, HDL, LDL, triglycerides)  Performed every 5 years for most people.  The goal for LDL is less than 100 mg/dL. If you are at high risk, the goal is less than 70 mg/dL.  The goal for HDL is 40 mg/dL 50 mg/dL for men and 50 mg/dL 60 mg/dL for women. An HDL cholesterol of 60 mg/dL or higher gives some protection against heart disease.  The goal for triglycerides is less than 150 mg/dL. Influenza vaccine, pneumococcal vaccine, and hepatitis B vaccine  The influenza vaccine is recommended yearly.  The pneumococcal vaccine is generally given once in a lifetime. However, there are some instances when another vaccination is recommended. Check with your health care provider.  The hepatitis B vaccine is also recommended for adults with diabetes. Diabetes self-management education  Education is recommended at diagnosis and ongoing as needed. Treatment plan  Your treatment plan is reviewed at every medical visit. Document Released: 07/02/2009 Document Revised: 05/07/2013 Document Reviewed: 02/04/2013 [facility] Patient Information 2014 [place].

## 2013-10-21 ENCOUNTER — Other Ambulatory Visit: Payer: Self-pay | Admitting: Internal Medicine

## 2013-10-29 ENCOUNTER — Other Ambulatory Visit: Payer: Self-pay | Admitting: *Deleted

## 2013-10-29 MED ORDER — NEBIVOLOL HCL 10 MG PO TABS: 20.0000 mg | ORAL_TABLET | Freq: Every day | ORAL | Status: DC

## 2014-02-05 ENCOUNTER — Other Ambulatory Visit: Payer: Self-pay | Admitting: Internal Medicine

## 2014-02-28 LAB — HM COLONOSCOPY

## 2014-02-28 LAB — COLOGUARD: Cologuard: NEGATIVE

## 2014-03-17 ENCOUNTER — Telehealth: Payer: Self-pay | Admitting: *Deleted

## 2014-03-17 NOTE — Telephone Encounter (Signed)
Called and spoke with the pt and informed him of his Cologuard results.  Informed the pt the Cologuard is negative-no evidence for colon cancer or abnormal growth.  Will reschedule in 3 years.  Pt understood and agreed.//AB/CMA

## 2014-03-25 ENCOUNTER — Encounter: Payer: Self-pay | Admitting: Internal Medicine

## 2014-04-02 ENCOUNTER — Encounter: Payer: Self-pay | Admitting: Internal Medicine

## 2014-04-02 ENCOUNTER — Ambulatory Visit (INDEPENDENT_AMBULATORY_CARE_PROVIDER_SITE_OTHER): Payer: BC Managed Care – PPO | Admitting: Internal Medicine

## 2014-04-02 ENCOUNTER — Other Ambulatory Visit (INDEPENDENT_AMBULATORY_CARE_PROVIDER_SITE_OTHER): Payer: BC Managed Care – PPO

## 2014-04-02 VITALS — BP 140/84 | HR 76 | Temp 98.5°F | Ht 70.0 in | Wt 305.8 lb

## 2014-04-02 DIAGNOSIS — E119 Type 2 diabetes mellitus without complications: Secondary | ICD-10-CM

## 2014-04-02 DIAGNOSIS — B353 Tinea pedis: Secondary | ICD-10-CM

## 2014-04-02 DIAGNOSIS — I10 Essential (primary) hypertension: Secondary | ICD-10-CM

## 2014-04-02 LAB — LIPID PANEL
Cholesterol: 236 mg/dL — ABNORMAL HIGH (ref 0–200)
HDL: 38.8 mg/dL — AB (ref >39.00)
LDL Cholesterol: 180 mg/dL — ABNORMAL HIGH (ref 0–99)
NonHDL: 197.2
Total CHOL/HDL Ratio: 6
Triglycerides: 84 mg/dL (ref 0.0–149.0)
VLDL: 16.8 mg/dL (ref 0.0–40.0)

## 2014-04-02 LAB — HEMOGLOBIN A1C: HEMOGLOBIN A1C: 5.9 % (ref 4.6–6.5)

## 2014-04-02 MED ORDER — KETOCONAZOLE 2 % EX CREA: 1.0000 "application " | TOPICAL_CREAM | Freq: Every day | CUTANEOUS | Status: DC

## 2014-04-02 NOTE — Assessment & Plan Note (Signed)
Weight trends reviewed Previously suggested consideration of lap band or other bariatric intervention - The patient is asked to continue improved diet and exercise patterns to aid in medical management of this problem.   Wt Readings from Last 3 Encounters:  04/02/14 305 lb 12.8 oz (138.71 kg)  10/03/13 342 lb 12.8 oz (155.493 kg)  04/14/13 327 lb (148.326 kg)

## 2014-04-02 NOTE — Assessment & Plan Note (Signed)
Difficult to control- initial dx 03/02/10 -  begun on hctz  then added ACEI 02/2010 - then added amlodipine 03/2010, inc dose 05/2010 changed to combo tribenzor 07/12/10;  tried tekturna but stopped when recalled in 2012-  added bystolic to tribenzor 2014 - tolerating reasonably well Continue efforts at lifestyle control   BP Readings from Last 3 Encounters:  04/02/14 140/84  10/03/13 144/98  04/14/13 140/82

## 2014-04-02 NOTE — Patient Instructions (Signed)
It was good to see you today.  We have reviewed your prior records including labs and tests today  Test(s) ordered today. Your results will be released to MyChart (or called to you) after review, usually within 72hours after test completion. If any changes need to be made, you will be notified at that same time.  Medications reviewed and updated, no changes recommended at this time. Fungus cream to your pharacy as requested  Please schedule followup in 6 months for annual and diabetes mellitus labs, call sooner if problems.

## 2014-04-02 NOTE — Progress Notes (Signed)
Pre visit review using our clinic review tool, if applicable. No additional management support is needed unless otherwise documented below in the visit note. 

## 2014-04-02 NOTE — Assessment & Plan Note (Signed)
Started metformin 04/2010 - did not start Januvia 09/2013 as recommended but has made significant weight reduction since last 2mo OV On ARB, declines statin Check a1c today - adjust meds if needed Weight trends reviewed The patient is asked to continue improved diet and exercise patterns to aid in medical management of this problem.  Lab Results  Component Value Date   HGBA1C 8.1* 10/03/2013

## 2014-04-02 NOTE — Progress Notes (Signed)
Subjective:    Patient ID: Jose Pacheco, male    DOB: 04-10-1956, 58 y.o.   MRN: [id number]  HPI  Patient here for follow up - DM, weight, HTN Reviewed chronic medical issues and interval medical events  Past Medical History  Diagnosis Date  . OBESITY, MORBID   . DIABETES MELLITUS, TYPE II   . ALLERGIC RHINITIS   . HYPERLIPIDEMIA   . HYPERTENSION     Review of Systems  Constitutional: Negative for fever, fatigue and unexpected weight change.  Respiratory: Negative for cough and shortness of breath.   Cardiovascular: Negative for chest pain and leg swelling.       Objective:   Physical Exam  BP 140/84  Pulse 76  Temp(Src) 98.5 F (36.9 C) (Oral)  Ht 5\' 10"  (1.778 m)  Wt 305 lb 12.8 oz (138.71 kg)  BMI 43.88 kg/m2  SpO2 96% Wt Readings from Last 3 Encounters:  04/02/14 305 lb 12.8 oz (138.71 kg)  10/03/13 342 lb 12.8 oz (155.493 kg)  04/14/13 327 lb (148.326 kg)   Constitutional: he is obese, but appears well-developed and well-nourished. No distress.  Neck: Normal range of motion. Neck supple. No JVD present. No thyromegaly present.  Cardiovascular: Normal rate, regular rhythm and normal heart sounds.  No murmur heard. No BLE edema. Pulmonary/Chest: Effort normal and breath sounds normal. No respiratory distress. he has no wheezes.  Skin: B feet with mild fungal changes intradigit Psychiatric: he has a normal mood and affect. His behavior is normal. Judgment and thought content normal.   Lab Results  Component Value Date   WBC 8.3 10/03/2013   HGB 15.3 10/03/2013   HCT 45.4 10/03/2013   PLT 265.0 10/03/2013   GLUCOSE 177* 10/03/2013   CHOL 202* 10/03/2013   TRIG 195.0* 10/03/2013   HDL 36.90* 10/03/2013   LDLDIRECT 144.0 10/03/2013   LDLCALC 120* 10/02/2012   ALT 27 10/03/2013   AST 16 10/03/2013   NA 140 10/03/2013   K 3.7 10/03/2013   CL 104 10/03/2013   CREATININE 1.2 10/03/2013   BUN 14 10/03/2013   CO2 29 10/03/2013   TSH 1.96 10/03/2013   PSA 2.89 10/02/2012     HGBA1C 8.1* 10/03/2013   MICROALBUR 0.4 04/14/2013    No results found.     Assessment & Plan:   Problem List Items Addressed This Visit   DIABETES MELLITUS, TYPE II - Primary      Started metformin 04/2010 - did not start Januvia 09/2013 as recommended but has made significant weight reduction since last 68mo OV On ARB, declines statin Check a1c today - adjust meds if needed Weight trends reviewed The patient is asked to continue improved diet and exercise patterns to aid in medical management of this problem.  Lab Results  Component Value Date   HGBA1C 8.1* 10/03/2013      Relevant Orders      Hemoglobin A1c      Lipid panel   HYPERTENSION      Difficult to control- initial dx 03/02/10 -  begun on hctz  then added ACEI 02/2010 - then added amlodipine 03/2010, inc dose 05/2010 changed to combo tribenzor 07/12/10;  tried tekturna but stopped when recalled in 2012-  added bystolic to tribenzor 2014 - tolerating reasonably well Continue efforts at lifestyle control   BP Readings from Last 3 Encounters:  04/02/14 140/84  10/03/13 144/98  04/14/13 140/82      OBESITY, MORBID      Weight trends  reviewed Previously suggested consideration of lap band or other bariatric intervention - The patient is asked to continue improved diet and exercise patterns to aid in medical management of this problem.   Wt Readings from Last 3 Encounters:  04/02/14 305 lb 12.8 oz (138.71 kg)  10/03/13 342 lb 12.8 oz (155.493 kg)  04/14/13 327 lb (148.326 kg)       Other Visit Diagnoses   Tinea pedis of both feet        Relevant Medications       ketoconazole (NIZORAL) 2 % cream

## 2014-04-06 ENCOUNTER — Encounter: Payer: Self-pay | Admitting: Internal Medicine

## 2014-09-02 ENCOUNTER — Encounter: Payer: Self-pay | Admitting: Internal Medicine

## 2014-10-01 ENCOUNTER — Other Ambulatory Visit: Payer: Self-pay | Admitting: Internal Medicine

## 2014-10-01 ENCOUNTER — Ambulatory Visit (INDEPENDENT_AMBULATORY_CARE_PROVIDER_SITE_OTHER): Payer: BLUE CROSS/BLUE SHIELD | Admitting: Internal Medicine

## 2014-10-01 ENCOUNTER — Other Ambulatory Visit (INDEPENDENT_AMBULATORY_CARE_PROVIDER_SITE_OTHER): Payer: BLUE CROSS/BLUE SHIELD

## 2014-10-01 ENCOUNTER — Encounter: Payer: Self-pay | Admitting: Internal Medicine

## 2014-10-01 VITALS — BP 140/90 | HR 61 | Temp 98.2°F | Ht 70.0 in | Wt 309.2 lb

## 2014-10-01 DIAGNOSIS — E119 Type 2 diabetes mellitus without complications: Secondary | ICD-10-CM

## 2014-10-01 DIAGNOSIS — I10 Essential (primary) hypertension: Secondary | ICD-10-CM

## 2014-10-01 DIAGNOSIS — E782 Mixed hyperlipidemia: Secondary | ICD-10-CM

## 2014-10-01 DIAGNOSIS — R9431 Abnormal electrocardiogram [ECG] [EKG]: Secondary | ICD-10-CM

## 2014-10-01 DIAGNOSIS — I4949 Other premature depolarization: Secondary | ICD-10-CM

## 2014-10-01 LAB — HEPATIC FUNCTION PANEL
ALK PHOS: 66 U/L (ref 39–117)
ALT: 20 U/L (ref 0–53)
AST: 17 U/L (ref 0–37)
Albumin: 4.1 g/dL (ref 3.5–5.2)
Bilirubin, Direct: 0.1 mg/dL (ref 0.0–0.3)
TOTAL PROTEIN: 7.7 g/dL (ref 6.0–8.3)
Total Bilirubin: 0.7 mg/dL (ref 0.2–1.2)

## 2014-10-01 LAB — MICROALBUMIN / CREATININE URINE RATIO
Creatinine,U: 143.3 mg/dL
MICROALB/CREAT RATIO: 0.1 mg/g (ref 0.0–30.0)
Microalb, Ur: 0.1 mg/dL (ref 0.0–1.9)

## 2014-10-01 LAB — BASIC METABOLIC PANEL
BUN: 18 mg/dL (ref 6–23)
CO2: 24 mEq/L (ref 19–32)
Calcium: 9.4 mg/dL (ref 8.4–10.5)
Chloride: 104 mEq/L (ref 96–112)
Creatinine, Ser: 1.11 mg/dL (ref 0.40–1.50)
GFR: 72.52 mL/min (ref >60.00)
Glucose, Bld: 139 mg/dL — ABNORMAL HIGH (ref 70–99)
POTASSIUM: 4.2 meq/L (ref 3.5–5.1)
Sodium: 141 mEq/L (ref 135–145)

## 2014-10-01 LAB — HEMOGLOBIN A1C: HEMOGLOBIN A1C: 6.6 % — AB (ref 4.6–6.5)

## 2014-10-01 LAB — TSH: TSH: 2.23 u[IU]/mL (ref 0.35–4.50)

## 2014-10-01 NOTE — Progress Notes (Signed)
   Subjective:    Patient ID: Jose Pacheco, male    DOB: [birth year], 59 y.o.   MRN: [id number]  HPI   He has driven 7 hours from  [place] to follow-up on his hypertension and diabetes. His has lived there for 3 years after a job transfer  He has been compliant with his medicines without adverse effects. His been a low-carb modified sodium restricted diet.  His exercise  decreased over the holidays. Typically he  lifts weights 2 times a week for 20 minutes without cardiopulmonary symptoms  He's not monitoring  his blood pressure. He has not been monitoring his sugars recently. Previously he had been monitoring the glucose prior to going to bed and it had been less than 100.  He does drink 4 cups of coffee a day.  Ophthalmologic exam is up-to-date.  He has no diabetic symptoms.  Labs were reviewed. LDL was not at goal of less than 100. He did reduce his A1c from 8.1% in January 2015  To 5.9% in July. He states his goal is to get off all medicines.  Review of Systems  Specifically he denies polyuria, polydipsia, polyphagia. He denies any numbness, or tingling in the extremities. He has no nonhealing skin lesions He has no postural hypotension.   Chest pain, palpitations, tachycardia, exertional dyspnea, paroxysmal nocturnal dyspnea, claudication or edema are absent.      Objective:   Physical Exam  Positive or pertinent findings include: Pattern alopecia is present. Hair is cut very closely. The nasal septum is deviated to the right. He has a slow  rhythm with occasional premature. Abdomen is massive with striae. He has a 14 x 8 mm nevus in the epigastrium which is teardrop shaped(monitorwith cell phone camera  Discussed). 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 Toenails are thickened. He has pes planus. He has crepitus of  Knees  General appearance :adequately nourished; in no distress.  Eyes: No conjunctival inflammation or scleral icterus is present.  Oral exam:  Dental hygiene is good. Lips and gums are healthy appearing.There is no oropharyngeal erythema or exudate noted.   Heart:  Normal rate and regular rhythm. S1 and S2 normal without gallop, murmur, click, rub or other extra sounds     Lungs:Chest clear to auscultation; no wheezes, rhonchi,rales ,or rubs present.No increased work of breathing.   Abdomen: bowel sounds normal, soft and non-tender without masses, organomegaly or hernias noted.  No guarding or rebound. No flank tenderness to percussion.  Vascular : all pulses equal ; no bruits present.  Skin:Warm & dry.  Intact without suspicious lesions or rashes ; no jaundice or tenting  Lymphatic: No lymphadenopathy is noted about the head, neck, axilla, or inguinal areas.             Assessment & Plan:  #1 metabolic syndrome with nondiabetic A1c in July.  #2 hypertension, home monitor not in place  #3 premature beats.Nonspecific ST-T wave changes on EKG with unifocal PVCs.No old EKG in Epic.  Plan: Pathophysiology of metabolic syndrome was discussed with him.  Labs will be reviewed and recommendations made  I have encouraged him to affiliate with a physician in the greater [place] area as the  [employer] is located there.  He's been asked to take uncoated aspirin the days he does have extensive travel such as driving to and from [place].

## 2014-10-01 NOTE — Progress Notes (Signed)
Pre visit review using our clinic review tool, if applicable. No additional management support is needed unless otherwise documented below in the visit note. 

## 2014-10-01 NOTE — Patient Instructions (Signed)
Your next office appointment will be determined based upon review of your pending labs. Those instructions will be transmitted to you through My Chart  Followup as needed for your acute issue. Please report any significant change in your symptoms.  To prevent palpitations or premature beats, avoid stimulants such as decongestants, diet pills, nicotine, or caffeine (coffee, tea, cola, or chocolate) to excess.  Take the EKG to any emergency room or preop visits. There are nonspecific changes; as long as there is no new change these are not clinically significant . If the old EKG is not available for comparison; it may result in unnecessary hospitalization for observation with significant unnecessary expense.

## 2014-10-03 LAB — NMR LIPOPROFILE WITH LIPIDS
CHOLESTEROL, TOTAL: 227 mg/dL — AB (ref 100–199)
HDL PARTICLE NUMBER: 27 umol/L — AB (ref >=30.5)
HDL SIZE: 8.4 nm — AB (ref >=9.2)
HDL-C: 48 mg/dL (ref >39)
LARGE HDL: 1.3 umol/L — AB (ref >=4.8)
LDL CALC: 160 mg/dL — AB (ref 0–99)
LDL PARTICLE NUMBER: 1890 nmol/L — AB (ref <1000)
LDL Size: 21.2 nm (ref >=20.8)
LP-IR Score: 46 — ABNORMAL HIGH (ref <=45)
Large VLDL-P: 1.2 nmol/L (ref <=2.7)
SMALL LDL PARTICLE NUMBER: 688 nmol/L — AB (ref <=527)
TRIGLYCERIDES: 94 mg/dL (ref 0–149)
VLDL Size: 39.5 nm (ref <=46.6)

## 2014-10-30 ENCOUNTER — Other Ambulatory Visit: Payer: Self-pay | Admitting: Internal Medicine

## 2015-01-17 ENCOUNTER — Other Ambulatory Visit: Payer: Self-pay | Admitting: Internal Medicine

## 2015-01-18 ENCOUNTER — Other Ambulatory Visit: Payer: Self-pay

## 2015-01-18 MED ORDER — METFORMIN HCL ER 500 MG PO TB24: 500.0000 mg | ORAL_TABLET | Freq: Every morning | ORAL | Status: DC

## 2015-01-18 NOTE — Telephone Encounter (Signed)
Left message advising pt that refill has been sent in for metformin per pt request

## 2015-01-24 ENCOUNTER — Other Ambulatory Visit: Payer: Self-pay | Admitting: Internal Medicine

## 2015-04-01 ENCOUNTER — Ambulatory Visit (INDEPENDENT_AMBULATORY_CARE_PROVIDER_SITE_OTHER): Payer: BLUE CROSS/BLUE SHIELD | Admitting: Internal Medicine

## 2015-04-01 ENCOUNTER — Encounter: Payer: Self-pay | Admitting: Internal Medicine

## 2015-04-01 ENCOUNTER — Other Ambulatory Visit (INDEPENDENT_AMBULATORY_CARE_PROVIDER_SITE_OTHER): Payer: BLUE CROSS/BLUE SHIELD

## 2015-04-01 VITALS — BP 139/92 | HR 76 | Temp 98.5°F | Resp 11 | Ht 70.0 in | Wt 325.0 lb

## 2015-04-01 DIAGNOSIS — E119 Type 2 diabetes mellitus without complications: Secondary | ICD-10-CM | POA: Diagnosis not present

## 2015-04-01 DIAGNOSIS — I10 Essential (primary) hypertension: Secondary | ICD-10-CM

## 2015-04-01 LAB — BASIC METABOLIC PANEL
BUN: 17 mg/dL (ref 6–23)
CALCIUM: 9.3 mg/dL (ref 8.4–10.5)
CO2: 27 mEq/L (ref 19–32)
CREATININE: 1.1 mg/dL (ref 0.40–1.50)
Chloride: 100 mEq/L (ref 96–112)
GFR: 73.15 mL/min (ref >60.00)
Glucose, Bld: 145 mg/dL — ABNORMAL HIGH (ref 70–99)
POTASSIUM: 3.6 meq/L (ref 3.5–5.1)
SODIUM: 137 meq/L (ref 135–145)

## 2015-04-01 LAB — HEMOGLOBIN A1C: HEMOGLOBIN A1C: 6.8 % — AB (ref 4.6–6.5)

## 2015-04-01 NOTE — Patient Instructions (Signed)
We will check the blood work today and call you back with the results.   For the throat I would recommend to try zyrtec generic medicine to help with the allergies and drainage. It may take up to 5 days to help.   You can swish and spit the peroxide but I would not swallow it as it can harm your swallowing pipe or esophagus.   You need to keep up the good work with exercise as the extra weight can cause the sugars to increase and the blood pressure. Work on losing 5 pounds before you come back for a physical in about 6 months.   Diabetes and Exercise Exercising regularly is important. It is not just about losing weight. It has many health benefits, such as:  Improving your overall fitness, flexibility, and endurance.  Increasing your bone density.  Helping with weight control.  Decreasing your body fat.  Increasing your muscle strength.  Reducing stress and tension.  Improving your overall health. People with diabetes who exercise gain additional benefits because exercise:  Reduces appetite.  Improves the body's use of blood sugar (glucose).  Helps lower or control blood glucose.  Decreases blood pressure.  Helps control blood lipids (such as cholesterol and triglycerides).  Improves the body's use of the hormone insulin by:  Increasing the body's insulin sensitivity.  Reducing the body's insulin needs.  Decreases the risk for heart disease because exercising:  Lowers cholesterol and triglycerides levels.  Increases the levels of good cholesterol (such as high-density lipoproteins [HDL]) in the body.  Lowers blood glucose levels. YOUR ACTIVITY PLAN  Choose an activity that you enjoy and set realistic goals. Your health care provider or diabetes educator can help you make an activity plan that works for you. Exercise regularly as directed by your health care provider. This includes:  Performing resistance training twice a week such as push-ups, sit-ups, lifting  weights, or using resistance bands.  Performing 150 minutes of cardio exercises each week such as walking, running, or playing sports.  Staying active and spending no more than 90 minutes at one time being inactive. Even short bursts of exercise are good for you. Three 10-minute sessions spread throughout the day are just as beneficial as a single 30-minute session. Some exercise ideas include:  Taking the dog for a walk.  Taking the stairs instead of the elevator.  Dancing to your favorite song.  Doing an exercise video.  Doing your favorite exercise with a friend. RECOMMENDATIONS FOR EXERCISING WITH TYPE 1 OR TYPE 2 DIABETES   Check your blood glucose before exercising. If blood glucose levels are greater than 240 mg/dL, check for urine ketones. Do not exercise if ketones are present.  Avoid injecting insulin into areas of the body that are going to be exercised. For example, avoid injecting insulin into:  The arms when playing tennis.  The legs when jogging.  Keep a record of:  Food intake before and after you exercise.  Expected peak times of insulin action.  Blood glucose levels before and after you exercise.  The type and amount of exercise you have done.  Review your records with your health care provider. Your health care provider will help you to develop guidelines for adjusting food intake and insulin amounts before and after exercising.  If you take insulin or oral hypoglycemic agents, watch for signs and symptoms of hypoglycemia. They include:  Dizziness.  Shaking.  Sweating.  Chills.  Confusion.  Drink plenty of water while you exercise  to prevent dehydration or heat stroke. Body water is lost during exercise and must be replaced.  Talk to your health care provider before starting an exercise program to make sure it is safe for you. Remember, almost any type of activity is better than none. Document Released: 11/25/2003 Document Revised: 01/19/2014  Document Reviewed: 02/11/2013 [facility] Patient Information 2015 [place], [place]. This information is not intended to replace advice given to you by your health care provider. Make sure you discuss any questions you have with your health care provider.

## 2015-04-01 NOTE — Assessment & Plan Note (Signed)
This has significantly worsened since last visit. Talked to him about the fact that his weight is likely the cause (or strong contributor) to his hypertension and his diabetes. He is going to start walking some (he is already lifting some) to see if this helps with the weights. Not willing to make dietary changes at today's visit and does not want to see a nutritionist.

## 2015-04-01 NOTE — Progress Notes (Signed)
   Subjective:    Patient ID: Jose Pacheco, male    DOB: 1956-08-19, 59 y.o.   MRN: [id number]  HPI The patient is a 59 YO man coming in for follow up of his diabetes and blood pressure. He has been struggling with them for several years. He is still taking his medicines and not having any side effects. He does not have any known complications of his diabetes and denies any signs of new complications. No headaches, fevers, chills, SOB.   Review of Systems  Constitutional: Negative.   Respiratory: Negative for cough, chest tightness, shortness of breath and wheezing.   Cardiovascular: Negative for chest pain, palpitations and leg swelling.  Gastrointestinal: Negative for nausea, abdominal pain, diarrhea, constipation and abdominal distention.  Musculoskeletal: Negative.   Neurological: Negative.       Objective:   Physical Exam  Constitutional: He is oriented to person, place, and time. He appears well-developed and well-nourished.  HENT:  Head: Normocephalic and atraumatic.  Eyes: EOM are normal.  Neck: Normal range of motion.  Cardiovascular: Normal rate and regular rhythm.   Pulmonary/Chest: Effort normal and breath sounds normal. No respiratory distress. He has no wheezes.  Abdominal: Soft. He exhibits no distension. There is no tenderness.  Musculoskeletal: He exhibits no edema.  Neurological: He is alert and oriented to person, place, and time. Coordination normal.  Skin: Skin is warm and dry.   Filed Vitals:   04/01/15 0826 04/01/15 0846  BP: 146/90 139/92  Pulse: 76   Temp: 98.5 F (36.9 C)   TempSrc: Oral   Resp: 11   Height: 5\' 10"  (1.778 m)   Weight: 325 lb (147.419 kg)   SpO2: 97%       Assessment & Plan:

## 2015-04-01 NOTE — Assessment & Plan Note (Signed)
BP marginally elevated today. He is up about 20 pounds since last visit and suspect that this is driving his increase in pressure. He will work on losing some of the weight. He is on max doses of olmesartan, amlodipine, hctz and his bystolic could be increased some (HR today 73). Will check BMP today for signs of kidney complications.

## 2015-04-01 NOTE — Progress Notes (Signed)
Pre visit review using our clinic review tool, if applicable. No additional management support is needed unless otherwise documented below in the visit note. 

## 2015-04-01 NOTE — Assessment & Plan Note (Addendum)
Checking HgA1c today, currently on metformin 500 mg XR daily. He is on ARB as well. Declines pneumonia vaccination today. No known complications. Reminded about need for yearly eye exam.

## 2015-05-13 ENCOUNTER — Other Ambulatory Visit: Payer: Self-pay | Admitting: Internal Medicine

## 2015-07-16 ENCOUNTER — Other Ambulatory Visit: Payer: Self-pay | Admitting: Internal Medicine

## 2015-10-07 ENCOUNTER — Other Ambulatory Visit (INDEPENDENT_AMBULATORY_CARE_PROVIDER_SITE_OTHER): Payer: BLUE CROSS/BLUE SHIELD

## 2015-10-07 ENCOUNTER — Encounter: Payer: Self-pay | Admitting: Internal Medicine

## 2015-10-07 ENCOUNTER — Ambulatory Visit (INDEPENDENT_AMBULATORY_CARE_PROVIDER_SITE_OTHER): Payer: BLUE CROSS/BLUE SHIELD | Admitting: Internal Medicine

## 2015-10-07 VITALS — BP 130/80 | HR 65 | Temp 98.4°F | Resp 16 | Ht 70.0 in | Wt 325.0 lb

## 2015-10-07 DIAGNOSIS — E669 Obesity, unspecified: Secondary | ICD-10-CM

## 2015-10-07 DIAGNOSIS — I10 Essential (primary) hypertension: Secondary | ICD-10-CM | POA: Diagnosis not present

## 2015-10-07 DIAGNOSIS — E1169 Type 2 diabetes mellitus with other specified complication: Secondary | ICD-10-CM

## 2015-10-07 DIAGNOSIS — E119 Type 2 diabetes mellitus without complications: Secondary | ICD-10-CM

## 2015-10-07 DIAGNOSIS — E782 Mixed hyperlipidemia: Secondary | ICD-10-CM | POA: Diagnosis not present

## 2015-10-07 LAB — HEMOGLOBIN A1C: Hgb A1c MFr Bld: 6.7 % — ABNORMAL HIGH (ref 4.6–6.5)

## 2015-10-07 LAB — COMPREHENSIVE METABOLIC PANEL
ALK PHOS: 67 U/L (ref 39–117)
ALT: 16 U/L (ref 0–53)
AST: 15 U/L (ref 0–37)
Albumin: 4.2 g/dL (ref 3.5–5.2)
BILIRUBIN TOTAL: 1.1 mg/dL (ref 0.2–1.2)
BUN: 15 mg/dL (ref 6–23)
CO2: 29 meq/L (ref 19–32)
CREATININE: 1.21 mg/dL (ref 0.40–1.50)
Calcium: 9.6 mg/dL (ref 8.4–10.5)
Chloride: 100 mEq/L (ref 96–112)
GFR: 65.41 mL/min (ref >60.00)
GLUCOSE: 137 mg/dL — AB (ref 70–99)
Potassium: 3.8 mEq/L (ref 3.5–5.1)
Sodium: 139 mEq/L (ref 135–145)
TOTAL PROTEIN: 7.7 g/dL (ref 6.0–8.3)

## 2015-10-07 LAB — LIPID PANEL
CHOL/HDL RATIO: 5
Cholesterol: 225 mg/dL — ABNORMAL HIGH (ref 0–200)
HDL: 42.1 mg/dL (ref >39.00)
LDL Cholesterol: 160 mg/dL — ABNORMAL HIGH (ref 0–99)
NonHDL: 182.82
Triglycerides: 116 mg/dL (ref 0.0–149.0)
VLDL: 23.2 mg/dL (ref 0.0–40.0)

## 2015-10-07 LAB — MICROALBUMIN / CREATININE URINE RATIO
CREATININE, U: 142.4 mg/dL
MICROALB/CREAT RATIO: 0.5 mg/g (ref 0.0–30.0)

## 2015-10-07 NOTE — Assessment & Plan Note (Signed)
Weight is the same. He is overall up from last year. He is taking metformin daily. Exercising only 2-3 days per week and not cardio. Talked to him about that at visit. Complicated by diabetes and high cholesterol and high blood pressure.

## 2015-10-07 NOTE — Assessment & Plan Note (Signed)
Checking lipid panel today, not on medicine at this time. Adjust as needed.

## 2015-10-07 NOTE — Patient Instructions (Signed)
Keep working on doing exercise to work on the [employer].   We are checking the labs today and will send you the results on mychart.   Diabetes and Standards of Medical Care Diabetes is complicated. You may find that your diabetes team includes a dietitian, nurse, diabetes educator, eye doctor, and more. To help everyone know what is going on and to help you get the care you deserve, the following schedule of care was developed to help keep you on track. Below are the tests, exams, vaccines, medicines, education, and plans you will need. HbA1c test This test shows how well you have controlled your glucose over the past 2-3 months. It is used to see if your diabetes management plan needs to be adjusted.   It is performed at least 2 times a year if you are meeting treatment goals.  It is performed 4 times a year if therapy has changed or if you are not meeting treatment goals. Blood pressure test  This test is performed at every routine medical visit. The goal is less than 140/90 mm Hg for most people, but 130/80 mm Hg in some cases. Ask your health care provider about your goal. Dental exam  Follow up with the dentist regularly. Eye exam  If you are diagnosed with type 1 diabetes as a child, get an exam upon reaching the age of 76 years or older and having had diabetes for 3-5 years. Yearly eye exams are recommended after that initial eye exam.  If you are diagnosed with type 1 diabetes as an adult, get an exam within 5 years of diagnosis and then yearly.  If you are diagnosed with type 2 diabetes, get an exam as soon as possible after the diagnosis and then yearly. Foot care exam  Visual foot exams are performed at every routine medical visit. The exams check for cuts, injuries, or other problems with the feet.  You should have a complete foot exam performed every year. This exam includes an inspection of the structure and skin of your feet, a check of the pulses in your feet, and a check of  the sensation in your feet.  Type 1 diabetes: The first exam is performed 5 years after diagnosis.  Type 2 diabetes: The first exam is performed at the time of diagnosis.  Check your feet nightly for cuts, injuries, or other problems with your feet. Tell your health care provider if anything is not healing. Kidney function test (urine microalbumin)  This test is performed once a year.  Type 1 diabetes: The first test is performed 5 years after diagnosis.  Type 2 diabetes: The first test is performed at the time of diagnosis.  A serum creatinine and estimated glomerular filtration rate (eGFR) test is done once a year to assess the level of chronic kidney disease (CKD), if present. Lipid profile (cholesterol, HDL, LDL, triglycerides)  Performed every 5 years for most people.  The goal for LDL is less than 100 mg/dL. If you are at high risk, the goal is less than 70 mg/dL.  The goal for HDL is 40 mg/dL-50 mg/dL for men and 50 mg/dL-60 mg/dL for women. An HDL cholesterol of 60 mg/dL or higher gives some protection against heart disease.  The goal for triglycerides is less than 150 mg/dL. Immunizations  The flu (influenza) vaccine is recommended yearly for every person 22 months of age or older who has diabetes.  The pneumonia (pneumococcal) vaccine is recommended for every person 42 years of age  or older who has diabetes. Adults 30 years of age or older may receive the pneumonia vaccine as a series of two separate shots.  The hepatitis B vaccine is recommended for adults shortly after they have been diagnosed with diabetes.  The Tdap (tetanus, diphtheria, and pertussis) vaccine should be given:  According to normal childhood vaccination schedules, for children.  Every 10 years, for adults who have diabetes. Diabetes self-management education  Education is recommended at diagnosis and ongoing as needed. Treatment plan  Your treatment plan is reviewed at every medical visit.    This information is not intended to replace advice given to you by your health care provider. Make sure you discuss any questions you have with your health care provider.   Document Released: 07/02/2009 Document Revised: 09/25/2014 Document Reviewed: 02/04/2013 Elsevier Interactive Patient Education [employer].

## 2015-10-07 NOTE — Progress Notes (Signed)
Pre visit review using our clinic review tool, if applicable. No additional management support is needed unless otherwise documented below in the visit note. 

## 2015-10-07 NOTE — Progress Notes (Signed)
   Subjective:    Patient ID: Jose Pacheco, male    DOB: March 20, 1956, 60 y.o.   MRN: [id number]  HPI The patient is a 60 YO man coming in for follow up of his medical conditions including his diabetes (weight stable from last visit, on metformin and olmesartan, not complicated), his blood pressure (initially elevated but okay on recheck, on olmesartan/hctz/amlodipine/bystolic, not complicated), and his weight (was to be working on weight loss but stable, lifting 2-3 times per week, diet not great). No new concerns.   Review of Systems  Constitutional: Negative.   HENT: Negative.   Eyes: Negative.   Respiratory: Negative for cough, chest tightness, shortness of breath and wheezing.   Cardiovascular: Negative for chest pain, palpitations and leg swelling.  Gastrointestinal: Negative for nausea, abdominal pain, diarrhea, constipation and abdominal distention.  Musculoskeletal: Negative.   Skin: Negative.   Neurological: Negative.   Psychiatric/Behavioral: Negative.       Objective:   Physical Exam  Constitutional: He is oriented to person, place, and time. He appears well-developed and well-nourished.  Obese  HENT:  Head: Normocephalic and atraumatic.  Eyes: EOM are normal.  Neck: Normal range of motion.  Cardiovascular: Normal rate and regular rhythm.   Pulmonary/Chest: Effort normal and breath sounds normal. No respiratory distress. He has no wheezes.  Abdominal: Soft. He exhibits no distension. There is no tenderness.  Musculoskeletal: He exhibits no edema.  Neurological: He is alert and oriented to person, place, and time. Coordination normal.  Skin: Skin is warm and dry.  Foot exam done   Filed Vitals:   10/07/15 0834 10/07/15 0913  BP: 160/96 130/80  Pulse: 65   Temp: 98.4 F (36.9 C)   TempSrc: Oral   Resp: 16   Height:  (1.778 m)   Weight: 325 lb (147.419 kg)   SpO2: 97%       Assessment & Plan:

## 2015-10-07 NOTE — Assessment & Plan Note (Signed)
Foot exam done today, reminded about eye exam. Checking HgA1c and adjust as needed. Asked him to work on weight loss as he is up from last year.

## 2015-10-07 NOTE — Assessment & Plan Note (Signed)
Controlled on 4 drug regimen of omlesartan, amlodipine, hctz, bystolic. Checking labs and adjust as needed. Talked to him about the fact that his weight is likely driving some of the high blood pressure.

## 2016-01-16 ENCOUNTER — Other Ambulatory Visit: Payer: Self-pay | Admitting: Internal Medicine

## 2016-01-28 ENCOUNTER — Other Ambulatory Visit: Payer: Self-pay | Admitting: Internal Medicine

## 2016-04-20 ENCOUNTER — Other Ambulatory Visit (INDEPENDENT_AMBULATORY_CARE_PROVIDER_SITE_OTHER): Payer: BLUE CROSS/BLUE SHIELD

## 2016-04-20 ENCOUNTER — Encounter: Payer: Self-pay | Admitting: Internal Medicine

## 2016-04-20 ENCOUNTER — Ambulatory Visit (INDEPENDENT_AMBULATORY_CARE_PROVIDER_SITE_OTHER): Payer: BLUE CROSS/BLUE SHIELD | Admitting: Internal Medicine

## 2016-04-20 VITALS — BP 140/80 | HR 62 | Temp 98.7°F | Resp 18 | Ht 70.0 in | Wt 334.8 lb

## 2016-04-20 DIAGNOSIS — Z0000 Encounter for general adult medical examination without abnormal findings: Secondary | ICD-10-CM | POA: Insufficient documentation

## 2016-04-20 DIAGNOSIS — E119 Type 2 diabetes mellitus without complications: Secondary | ICD-10-CM | POA: Diagnosis not present

## 2016-04-20 DIAGNOSIS — I10 Essential (primary) hypertension: Secondary | ICD-10-CM

## 2016-04-20 LAB — HEMOGLOBIN A1C: HEMOGLOBIN A1C: 7.7 % — AB (ref 4.6–6.5)

## 2016-04-20 MED ORDER — OLMESARTAN-AMLODIPINE-HCTZ 40-10-25 MG PO TABS: 1.0000 | ORAL_TABLET | Freq: Every day | ORAL | 3 refills | Status: DC

## 2016-04-20 MED ORDER — NEBIVOLOL HCL 10 MG PO TABS: 20.0000 mg | ORAL_TABLET | Freq: Every day | ORAL | 3 refills | Status: DC

## 2016-04-20 NOTE — Assessment & Plan Note (Signed)
Weight is up about 10 pounds due to stress in life and he is working to get back on track. Goal is <300 pounds.

## 2016-04-20 NOTE — Progress Notes (Signed)
Pre visit review using our clinic review tool, if applicable. No additional management support is needed unless otherwise documented below in the visit note. 

## 2016-04-20 NOTE — Assessment & Plan Note (Signed)
Colonoscopy up to date, counseled about the need for pneumonia shot and hep c screening which he declines. Counseled about the dangers of distracted driving. Given screening recommendations.

## 2016-04-20 NOTE — Progress Notes (Signed)
   Subjective:    Patient ID: Jose Pacheco, male    DOB: 01-05-1956, 60 y.o.   MRN: [id number]  HPI The patient is a 60 YO man coming in for wellness. No new concerns. Has changed jobs and locations. Diet has fallen out with the stress of changing jobs.   PMH, [facility], social history reviewed and updated.   Review of Systems  Constitutional: Negative.   HENT: Negative.   Eyes: Negative.   Respiratory: Negative for cough, chest tightness, shortness of breath and wheezing.   Cardiovascular: Negative for chest pain, palpitations and leg swelling.  Gastrointestinal: Negative for abdominal distention, abdominal pain, constipation, diarrhea and nausea.  Musculoskeletal: Negative.   Skin: Negative.   Neurological: Negative.   Psychiatric/Behavioral: Negative.       Objective:   Physical Exam  Constitutional: He is oriented to person, place, and time. He appears well-developed and well-nourished.  Obese  HENT:  Head: Normocephalic and atraumatic.  Eyes: EOM are normal.  Neck: Normal range of motion.  Cardiovascular: Normal rate and regular rhythm.   Pulmonary/Chest: Effort normal and breath sounds normal. No respiratory distress. He has no wheezes.  Abdominal: Soft. Bowel sounds are normal. He exhibits no distension. There is no tenderness.  Musculoskeletal: He exhibits no edema.  Neurological: He is alert and oriented to person, place, and time. Coordination normal.  Skin: Skin is warm and dry.   Vitals:   04/20/16 1007  BP: 140/80  Pulse: 62  Resp: 18  Temp: 98.7 F (37.1 C)  TempSrc: Oral  SpO2: 98%  Weight: (!) 334 lb 12.8 oz (151.9 kg)  Height: 5\' 10"  (1.778 m)      Assessment & Plan:

## 2016-04-20 NOTE — Assessment & Plan Note (Signed)
Checking HgA1c, weight up about 10 pounds and he would like to work on changing diet before changing meds. Checking HgA1c, on ARB as well. Taking metformin daily.

## 2016-04-20 NOTE — Assessment & Plan Note (Signed)
BP at goal on olmesartan/hctz and nebivolol and recent CMP at goal on meds. No adjustment.

## 2016-04-20 NOTE — Patient Instructions (Addendum)
We will check the labs today and call you back with the results.  Work on getting back on track with the diet. Increased amounts of salt can cause swelling in the legs.  Health Maintenance, Male A healthy lifestyle and preventative care can promote health and wellness.  Maintain regular health, dental, and eye exams.  Eat a healthy diet. Foods like vegetables, fruits, whole grains, low-fat dairy products, and lean protein foods contain the nutrients you need and are low in calories. Decrease your intake of foods high in solid fats, added sugars, and salt. Get information about a proper diet from your health care provider, if necessary.  Regular physical exercise is one of the most important things you can do for your health. Most adults should get at least 150 minutes of moderate-intensity exercise (any activity that increases your heart rate and causes you to sweat) each week. In addition, most adults need muscle-strengthening exercises on 2 or more days a week.   Maintain a healthy weight. The body mass index (BMI) is a screening tool to identify possible weight problems. It provides an estimate of body fat based on height and weight. Your health care provider can find your BMI and can help you achieve or maintain a healthy weight. For males 20 years and older:  A BMI below 18.5 is considered underweight.  A BMI of 18.5 to 24.9 is normal.  A BMI of 25 to 29.9 is considered overweight.  A BMI of 30 and above is considered obese.  Maintain normal blood lipids and cholesterol by exercising and minimizing your intake of saturated fat. Eat a balanced diet with plenty of fruits and vegetables. Blood tests for lipids and cholesterol should begin at age 16 and be repeated every 5 years. If your lipid or cholesterol levels are high, you are over age 14, or you are at high risk for heart disease, you may need your cholesterol levels checked more frequently.Ongoing high lipid and cholesterol levels  should be treated with medicines if diet and exercise are not working.  If you smoke, find out from your health care provider how to quit. If you do not use tobacco, do not start.  Lung cancer screening is recommended for adults aged 55-80 years who are at high risk for developing lung cancer because of a history of smoking. A yearly low-dose CT scan of the lungs is recommended for people who have at least a 30-pack-year history of smoking and are current smokers or have quit within the past 15 years. A pack year of smoking is smoking an average of 1 pack of cigarettes a day for 1 year (for example, a 30-pack-year history of smoking could mean smoking 1 pack a day for 30 years or 2 packs a day for 15 years). Yearly screening should continue until the smoker has stopped smoking for at least 15 years. Yearly screening should be stopped for people who develop a health problem that would prevent them from having lung cancer treatment.  If you choose to drink alcohol, do not have more than 2 drinks per day. One drink is considered to be 12 oz (360 mL) of beer, 5 oz (150 mL) of wine, or 1.5 oz (45 mL) of liquor.  Avoid the use of street drugs. Do not share needles with anyone. Ask for help if you need support or instructions about stopping the use of drugs.  High blood pressure causes heart disease and increases the risk of stroke. High blood pressure is more  likely to develop in:  People who have blood pressure in the end of the normal range (100-139/85-89 mm Hg).  People who are overweight or obese.  People who are African American.  If you are 24-65 years of age, have your blood pressure checked every 3-5 years. If you are 19 years of age or older, have your blood pressure checked every year. You should have your blood pressure measured twice--once when you are at a hospital or clinic, and once when you are not at a hospital or clinic. Record the average of the two measurements. To check your blood  pressure when you are not at a hospital or clinic, you can use:  An automated blood pressure machine at a pharmacy.  A home blood pressure monitor.  If you are 80-49 years old, ask your health care provider if you should take aspirin to prevent heart disease.  Diabetes screening involves taking a blood sample to check your fasting blood sugar level. This should be done once every 3 years after age 47 if you are at a normal weight and without risk factors for diabetes. Testing should be considered at a younger age or be carried out more frequently if you are overweight and have at least 1 risk factor for diabetes.  Colorectal cancer can be detected and often prevented. Most routine colorectal cancer screening begins at the age of 71 and continues through age 23. However, your health care provider may recommend screening at an earlier age if you have risk factors for colon cancer. On a yearly basis, your health care provider may provide home test kits to check for hidden blood in the stool. A small camera at the end of a tube may be used to directly examine the colon (sigmoidoscopy or colonoscopy) to detect the earliest forms of colorectal cancer. Talk to your health care provider about this at age 33 when routine screening begins. A direct exam of the colon should be repeated every 5-10 years through age 72, unless early forms of precancerous polyps or small growths are found.  People who are at an increased risk for hepatitis B should be screened for this virus. You are considered at high risk for hepatitis B if:  You were born in a country where hepatitis B occurs often. Talk with your health care provider about which countries are considered high risk.  Your parents were born in a high-risk country and you have not received a shot to protect against hepatitis B (hepatitis B vaccine).  You have HIV or AIDS.  You use needles to inject street drugs.  You live with, or have sex with, someone who  has hepatitis B.  You are a man who has sex with other men (MSM).  You get hemodialysis treatment.  You take certain medicines for conditions like cancer, organ transplantation, and autoimmune conditions.  Hepatitis C blood testing is recommended for all people born from [birth year] through 1964 and any individual with known risk factors for hepatitis C.  Healthy men should no longer receive prostate-specific antigen (PSA) blood tests as part of routine cancer screening. Talk to your health care provider about prostate cancer screening.  Testicular cancer screening is not recommended for adolescents or adult males who have no symptoms. Screening includes self-exam, a health care provider exam, and other screening tests. Consult with your health care provider about any symptoms you have or any concerns you have about testicular cancer.  Practice safe sex. Use condoms and avoid high-risk sexual practices  to reduce the spread of sexually transmitted infections (STIs).  You should be screened for STIs, including gonorrhea and chlamydia if:  You are sexually active and are younger than 24 years.  You are older than 24 years, and your health care provider tells you that you are at risk for this type of infection.  Your sexual activity has changed since you were last screened, and you are at an increased risk for chlamydia or gonorrhea. Ask your health care provider if you are at risk.  If you are at risk of being infected with HIV, it is recommended that you take a prescription medicine daily to prevent HIV infection. This is called pre-exposure prophylaxis (PrEP). You are considered at risk if:  You are a man who has sex with other men (MSM).  You are a heterosexual man who is sexually active with multiple partners.  You take drugs by injection.  You are sexually active with a partner who has HIV.  Talk with your health care provider about whether you are at high risk of being infected with  HIV. If you choose to begin PrEP, you should first be tested for HIV. You should then be tested every 3 months for as long as you are taking PrEP.  Use sunscreen. Apply sunscreen liberally and repeatedly throughout the day. You should seek shade when your shadow is shorter than you. Protect yourself by wearing long sleeves, pants, a wide-brimmed hat, and sunglasses year round whenever you are outdoors.  Tell your health care provider of new moles or changes in moles, especially if there is a change in shape or color. Also, tell your health care provider if a mole is larger than the size of a pencil eraser.  A one-time screening for abdominal aortic aneurysm (AAA) and surgical repair of large AAAs by ultrasound is recommended for men aged 52-75 years who are current or former smokers.  Stay current with your vaccines (immunizations).   This information is not intended to replace advice given to you by your health care provider. Make sure you discuss any questions you have with your health care provider.   Document Released: 03/02/2008 Document Revised: 09/25/2014 Document Reviewed: 01/30/2011 Elsevier Interactive Patient Education [employer].

## 2016-04-25 ENCOUNTER — Other Ambulatory Visit: Payer: Self-pay | Admitting: Internal Medicine

## 2016-07-14 ENCOUNTER — Other Ambulatory Visit: Payer: Self-pay | Admitting: Internal Medicine

## 2016-10-05 ENCOUNTER — Ambulatory Visit: Payer: BLUE CROSS/BLUE SHIELD | Admitting: Internal Medicine

## 2016-10-11 ENCOUNTER — Other Ambulatory Visit: Payer: Self-pay | Admitting: Internal Medicine

## 2017-01-07 ENCOUNTER — Other Ambulatory Visit: Payer: Self-pay | Admitting: Internal Medicine

## 2017-02-22 ENCOUNTER — Telehealth: Payer: Self-pay | Admitting: Internal Medicine

## 2017-02-22 NOTE — Telephone Encounter (Signed)
Patient was called to reschedule appt for July with Dr. Okey Duprerawford for 6 month fu.  Patient can not come in June b/c he is currently in [place].  Patient would like to know if he could see someone else? Or is this something that can wait until provider is back in the office in Oct?  Please advise.

## 2017-02-22 NOTE — Telephone Encounter (Signed)
Is it ok for patient to see another provider

## 2017-02-23 NOTE — Telephone Encounter (Signed)
Okay to see someone else  

## 2017-02-26 NOTE — Telephone Encounter (Signed)
Left patient vm to call back to schedule.  °

## 2017-03-29 ENCOUNTER — Other Ambulatory Visit (INDEPENDENT_AMBULATORY_CARE_PROVIDER_SITE_OTHER): Payer: BLUE CROSS/BLUE SHIELD

## 2017-03-29 ENCOUNTER — Ambulatory Visit: Payer: BLUE CROSS/BLUE SHIELD | Admitting: Internal Medicine

## 2017-03-29 ENCOUNTER — Ambulatory Visit (INDEPENDENT_AMBULATORY_CARE_PROVIDER_SITE_OTHER): Payer: BLUE CROSS/BLUE SHIELD | Admitting: Nurse Practitioner

## 2017-03-29 ENCOUNTER — Encounter: Payer: Self-pay | Admitting: Nurse Practitioner

## 2017-03-29 VITALS — BP 150/88 | HR 64 | Temp 98.2°F | Ht 70.0 in | Wt 331.0 lb

## 2017-03-29 DIAGNOSIS — E119 Type 2 diabetes mellitus without complications: Secondary | ICD-10-CM

## 2017-03-29 DIAGNOSIS — R600 Localized edema: Secondary | ICD-10-CM | POA: Diagnosis not present

## 2017-03-29 DIAGNOSIS — E782 Mixed hyperlipidemia: Secondary | ICD-10-CM

## 2017-03-29 DIAGNOSIS — I10 Essential (primary) hypertension: Secondary | ICD-10-CM | POA: Diagnosis not present

## 2017-03-29 LAB — HEMOGLOBIN A1C: Hgb A1c MFr Bld: 8.9 % — ABNORMAL HIGH (ref 4.6–6.5)

## 2017-03-29 LAB — HEPATIC FUNCTION PANEL
ALT: 16 U/L (ref 0–53)
AST: 13 U/L (ref 0–37)
Albumin: 3.9 g/dL (ref 3.5–5.2)
Alkaline Phosphatase: 64 U/L (ref 39–117)
BILIRUBIN DIRECT: 0.3 mg/dL (ref 0.0–0.3)
BILIRUBIN TOTAL: 1 mg/dL (ref 0.2–1.2)
TOTAL PROTEIN: 7.3 g/dL (ref 6.0–8.3)

## 2017-03-29 LAB — LIPID PANEL
CHOLESTEROL: 208 mg/dL — AB (ref 0–200)
HDL: 43.9 mg/dL (ref >39.00)
LDL CALC: 142 mg/dL — AB (ref 0–99)
NonHDL: 163.81
Total CHOL/HDL Ratio: 5
Triglycerides: 107 mg/dL (ref 0.0–149.0)
VLDL: 21.4 mg/dL (ref 0.0–40.0)

## 2017-03-29 LAB — BASIC METABOLIC PANEL
BUN: 12 mg/dL (ref 6–23)
CALCIUM: 9.2 mg/dL (ref 8.4–10.5)
CHLORIDE: 101 meq/L (ref 96–112)
CO2: 27 mEq/L (ref 19–32)
CREATININE: 1.13 mg/dL (ref 0.40–1.50)
GFR: 85.22 mL/min (ref >60.00)
Glucose, Bld: 188 mg/dL — ABNORMAL HIGH (ref 70–99)
Potassium: 3.7 mEq/L (ref 3.5–5.1)
Sodium: 137 mEq/L (ref 135–145)

## 2017-03-29 MED ORDER — NEBIVOLOL HCL 10 MG PO TABS: 20.0000 mg | ORAL_TABLET | Freq: Every day | ORAL | 1 refills | Status: DC

## 2017-03-29 MED ORDER — METFORMIN HCL ER (MOD) 1000 MG PO TB24: 1000.0000 mg | ORAL_TABLET | Freq: Every day | ORAL | 1 refills | Status: DC

## 2017-03-29 MED ORDER — OLMESARTAN-AMLODIPINE-HCTZ 40-10-25 MG PO TABS: 1.0000 | ORAL_TABLET | Freq: Every day | ORAL | 1 refills | Status: DC

## 2017-03-29 NOTE — Patient Instructions (Signed)
Go to basement for blood draw. You will be called with lab results. Will review lab results prior to send medication refill.   DASH Eating Plan DASH stands for "Dietary Approaches to Stop Hypertension." The DASH eating plan is a healthy eating plan that has been shown to reduce high blood pressure (hypertension). It may also reduce your risk for type 2 diabetes, heart disease, and stroke. The DASH eating plan may also help with weight loss. What are tips for following this plan? General guidelines  Avoid eating more than 2,300 mg (milligrams) of salt (sodium) a day. If you have hypertension, you may need to reduce your sodium intake to 1,500 mg a day.  Limit alcohol intake to no more than 1 drink a day for nonpregnant women and 2 drinks a day for men. One drink equals 12 oz of beer, 5 oz of wine, or 1 oz of hard liquor.  Work with your health care provider to maintain a healthy body weight or to lose weight. Ask what an ideal weight is for you.  Get at least 30 minutes of exercise that causes your heart to beat faster (aerobic exercise) most days of the week. Activities may include walking, swimming, or biking.  Work with your health care provider or diet and nutrition specialist (dietitian) to adjust your eating plan to your individual calorie needs. Reading food labels  Check food labels for the amount of sodium per serving. Choose foods with less than 5 percent of the Daily Value of sodium. Generally, foods with less than 300 mg of sodium per serving fit into this eating plan.  To find whole grains, look for the word "whole" as the first word in the ingredient list. Shopping  Buy products labeled as "low-sodium" or "no salt added."  Buy fresh foods. Avoid canned foods and premade or frozen meals. Cooking  Avoid adding salt when cooking. Use salt-free seasonings or herbs instead of table salt or sea salt. Check with your health care provider or pharmacist before using salt  substitutes.  Do not fry foods. Cook foods using healthy methods such as baking, boiling, grilling, and broiling instead.  Cook with heart-healthy oils, such as olive, canola, soybean, or sunflower oil. Meal planning   Eat a balanced diet that includes: ? 5 or more servings of fruits and vegetables each day. At each meal, try to fill half of your plate with fruits and vegetables. ? Up to 6-8 servings of whole grains each day. ? Less than 6 oz of lean meat, poultry, or fish each day. A 3-oz serving of meat is about the same size as a deck of cards. One egg equals 1 oz. ? 2 servings of low-fat dairy each day. ? A serving of nuts, seeds, or beans 5 times each week. ? Heart-healthy fats. Healthy fats called Omega-3 fatty acids are found in foods such as flaxseeds and coldwater fish, like sardines, salmon, and mackerel.  Limit how much you eat of the following: ? Canned or prepackaged foods. ? Food that is high in trans fat, such as fried foods. ? Food that is high in saturated fat, such as fatty meat. ? Sweets, desserts, sugary drinks, and other foods with added sugar. ? Full-fat dairy products.  Do not salt foods before eating.  Try to eat at least 2 vegetarian meals each week.  Eat more home-cooked food and less restaurant, buffet, and fast food.  When eating at a restaurant, ask that your food be prepared with less salt  or no salt, if possible. What foods are recommended? The items listed may not be a complete list. Talk with your dietitian about what dietary choices are best for you. Grains Whole-grain or whole-wheat bread. Whole-grain or whole-wheat pasta. Brown rice. [place]. Bulgur. Whole-grain and low-sodium cereals. Pita bread. Low-fat, low-sodium crackers. Whole-wheat flour tortillas. Vegetables Fresh or frozen vegetables (raw, steamed, roasted, or grilled). Low-sodium or reduced-sodium tomato and vegetable juice. Low-sodium or reduced-sodium tomato sauce and tomato  paste. Low-sodium or reduced-sodium canned vegetables. Fruits All fresh, dried, or frozen fruit. Canned fruit in natural juice (without added sugar). Meat and other protein foods Skinless chicken or [place]. Ground chicken or [place]. Pork with fat trimmed off. Fish and seafood. Egg whites. Dried beans, peas, or lentils. Unsalted nuts, nut butters, and seeds. Unsalted canned beans. Lean cuts of beef with fat trimmed off. Low-sodium, lean deli meat. Dairy Low-fat (1%) or fat-free (skim) milk. Fat-free, low-fat, or reduced-fat cheeses. Nonfat, low-sodium ricotta or cottage cheese. Low-fat or nonfat yogurt. Low-fat, low-sodium cheese. Fats and oils Soft margarine without trans fats. Vegetable oil. Low-fat, reduced-fat, or light mayonnaise and salad dressings (reduced-sodium). Canola, safflower, olive, soybean, and sunflower oils. Avocado. Seasoning and other foods Herbs. Spices. Seasoning mixes without salt. Unsalted popcorn and pretzels. Fat-free sweets. What foods are not recommended? The items listed may not be a complete list. Talk with your dietitian about what dietary choices are best for you. Grains Baked goods made with fat, such as croissants, muffins, or some breads. Dry pasta or rice meal packs. Vegetables Creamed or fried vegetables. Vegetables in a cheese sauce. Regular canned vegetables (not low-sodium or reduced-sodium). Regular canned tomato sauce and paste (not low-sodium or reduced-sodium). Regular tomato and vegetable juice (not low-sodium or reduced-sodium). Angie Fava. Olives. Fruits Canned fruit in a light or heavy syrup. Fried fruit. Fruit in cream or butter sauce. Meat and other protein foods Fatty cuts of meat. Ribs. Fried meat. Berniece Salines. Sausage. Bologna and other processed lunch meats. Salami. Fatback. Hotdogs. Bratwurst. Salted nuts and seeds. Canned beans with added salt. Canned or smoked fish. Whole eggs or egg yolks. Chicken or [place] with skin. Dairy Whole or 2% milk,  cream, and half-and-half. Whole or full-fat cream cheese. Whole-fat or sweetened yogurt. Full-fat cheese. Nondairy creamers. Whipped toppings. Processed cheese and cheese spreads. Fats and oils Butter. Stick margarine. Lard. Shortening. Ghee. Bacon fat. Tropical oils, such as coconut, palm kernel, or palm oil. Seasoning and other foods Salted popcorn and pretzels. Onion salt, garlic salt, seasoned salt, table salt, and sea salt. Worcestershire sauce. Tartar sauce. Barbecue sauce. Teriyaki sauce. Soy sauce, including reduced-sodium. Steak sauce. Canned and packaged gravies. Fish sauce. Oyster sauce. Cocktail sauce. Horseradish that you find on the shelf. Ketchup. Mustard. Meat flavorings and tenderizers. Bouillon cubes. Hot sauce and Tabasco sauce. Premade or packaged marinades. Premade or packaged taco seasonings. Relishes. Regular salad dressings. Where to find more information:  National Heart, Lung, and [place]: https://wilson-eaton.com/  American Heart Association: www.heart.org Summary  The DASH eating plan is a healthy eating plan that has been shown to reduce high blood pressure (hypertension). It may also reduce your risk for type 2 diabetes, heart disease, and stroke.  With the DASH eating plan, you should limit salt (sodium) intake to 2,300 mg a day. If you have hypertension, you may need to reduce your sodium intake to 1,500 mg a day.  When on the DASH eating plan, aim to eat more fresh fruits and vegetables, whole grains, lean proteins, low-fat dairy,  and heart-healthy fats.  Work with your health care provider or diet and nutrition specialist (dietitian) to adjust your eating plan to your individual calorie needs. This information is not intended to replace advice given to you by your health care provider. Make sure you discuss any questions you have with your health care provider. Document Released: 08/24/2011 Document Revised: 08/28/2016 Document Reviewed: 08/28/2016 Elsevier  Interactive Patient Education  2017 Else[employer] Inc[employer]   Chronic Venous Insufficiency Chronic venous insufficiency, also called venous stasis, is a condition that prevents blood from being pumped effectively through the veins in your legs. Blood may no longer be pumped effectively from the legs back to the heart. This condition can range from mild to severe. With proper treatment, you should be able to continue with an active life. What are the causes? Chronic venous insufficiency occurs when the vein walls become stretched, weakened, or damaged, or when valves within the vein are damaged. Some common causes of this include:  High blood pressure inside the veins (venous hypertension).  Increased blood pressure in the leg veins from long periods of sitting or standing.  A blood clot that blocks blood flow in a vein (deep vein thrombosis, DVT).  Inflammation of a vein (phlebitis) that causes a blood clot to form.  Tumors in the pelvis that cause blood to back up.  What increases the risk? The following factors may make you more likely to develop this condition:  Having a family history of this condition.  Obesity.  Pregnancy.  Living without enough physical activity or exercise (sedentary lifestyle).  Smoking.  Having a job that requires long periods of standing or sitting in one place.  Being a certain age. Women in their 8540s and 5350s and men in their 7170s are more likely to develop this condition.  What are the signs or symptoms? Symptoms of this condition include:  Veins that are enlarged, bulging, or twisted (varicose veins).  Skin breakdown or ulcers.  Reddened or discolored skin on the front of the leg.  Brown, smooth, tight, and painful skin just above the ankle, usually on the inside of the leg (lipodermatosclerosis).  Swelling.  How is this diagnosed? This condition may be diagnosed based on:  Your medical history.  A physical exam.  Tests, such as: ? A  procedure that creates an image of a blood vessel and nearby organs and provides information about blood flow through the blood vessel (duplex ultrasound). ? A procedure that tests blood flow (plethysmography). ? A procedure to look at the veins using X-ray and dye (venogram).  How is this treated? The goals of treatment are to help you return to an active life and to minimize pain or disability. Treatment depends on the severity of your condition, and it may include:  Wearing compression stockings. These can help relieve symptoms and help prevent your condition from getting worse. However, they do not cure the condition.  Sclerotherapy. This is a procedure involving an injection of a material that "dissolves" damaged veins.  Surgery. This may involve: ? Removing a diseased vein (vein stripping). ? Cutting off blood flow through the vein (laser ablation surgery). ? Repairing a valve.  Follow these instructions at home:  Wear compression stockings as told by your health care provider. These stockings help to prevent blood clots and reduce swelling in your legs.  Take over-the-counter and prescription medicines only as told by your health care provider.  Stay active by exercising, walking, or doing different activities. Ask your health  care provider what activities are safe for you and how much exercise you need.  Drink enough fluid to keep your urine clear or pale yellow.  Do not use any products that contain nicotine or tobacco, such as cigarettes and e-cigarettes. If you need help quitting, ask your health care provider.  Keep all follow-up visits as told by your health care provider. This is important. Contact a health care provider if:  You have redness, swelling, or more pain in the affected area.  You see a red streak or line that extends up or down from the affected area.  You have skin breakdown or a loss of skin in the affected area, even if the breakdown is small.  You  get an injury in the affected area. Get help right away if:  You get an injury and an open wound in the affected area.  You have severe pain that does not get better with medicine.  You have sudden numbness or weakness in the foot or ankle below the affected area, or you have trouble moving your foot or ankle.  You have a fever and you have worse or persistent symptoms.  You have chest pain.  You have shortness of breath. Summary  Chronic venous insufficiency, also called venous stasis, is a condition that prevents blood from being pumped effectively through the veins in your legs.  Chronic venous insufficiency occurs when the vein walls become stretched, weakened, or damaged, or when valves within the vein are damaged.  Treatment for this condition depends on how severe your condition is, and it may involve wearing compression stockings or having a procedure.  Make sure you stay active by exercising, walking, or doing different activities. Ask your health care provider what activities are safe for you and how much exercise you need. This information is not intended to replace advice given to you by your health care provider. Make sure you discuss any questions you have with your health care provider. Document Released: 01/08/2007 Document Revised: 07/24/2016 Document Reviewed: 07/24/2016 Elsevier Interactive Patient Education  2017 [employer].

## 2017-03-29 NOTE — Progress Notes (Signed)
Subjective:  Patient ID: Jose Pacheco, male    DOB: [birth year]  Age: 61 y.o. MRN: [id number]  CC: Follow-up (6 mo fu--)   HPI  DM: Does not check glucose at home. Maintains low carb diet and portion control Exercise some days of the week: tai chi and weight lifting. Denies any adverse effects with metformin.  HTN: Stable. Does not follow low salt diet.  Edema: Chronic Improves with elevation. No claudication. Current job involves prolonged sitting.  Outpatient Medications Prior to Visit  Medication Sig Dispense Refill  . metFORMIN (GLUCOPHAGE-XR) 500 MG 24 hr tablet TAKE ONE TABLET BY MOUTH EVERY MORNING 90 tablet 0  . nebivolol (BYSTOLIC) 10 MG tablet Take 2 tablets (20 mg total) by mouth daily. 180 tablet 3  . Olmesartan-Amlodipine-HCTZ 40-10-25 MG TABS Take 1 tablet by mouth daily. 90 tablet 3  . BYSTOLIC 10 MG tablet TAKE 2 TABLETS BY MOUTH EVERY DAY (Patient not taking: Reported on 03/29/2017) 180 tablet 3   No facility-administered medications prior to visit.     ROS See HPI  Objective:  BP (!) 150/88   Pulse 64   Temp 98.2 F (36.8 C)   Ht '5\' 10"'  (1.778 m)   Wt (!) 331 lb (150.1 kg)   SpO2 99%   BMI 47.49 kg/m   BP Readings from Last 3 Encounters:  03/29/17 (!) 150/88  04/20/16 140/80  10/07/15 130/80    Wt Readings from Last 3 Encounters:  03/29/17 (!) 331 lb (150.1 kg)  04/20/16 (!) 334 lb 12.8 oz (151.9 kg)  10/07/15 (!) 325 lb (147.4 kg)    Physical Exam  Constitutional: He is oriented to person, place, and time. No distress.  Neck: Normal range of motion. Neck supple.  Cardiovascular: Normal rate, regular rhythm and normal heart sounds.   Pulmonary/Chest: Effort normal and breath sounds normal.  Abdominal: He exhibits no distension.  Musculoskeletal: Normal range of motion. He exhibits edema. He exhibits no tenderness.  Normal diabetic foot exam  Lymphadenopathy:    He has no cervical adenopathy.  Neurological: He is alert and  oriented to person, place, and time.  Skin: Skin is warm and dry. No rash noted. No erythema.  Vitals reviewed.   Lab Results  Component Value Date   WBC 8.3 10/03/2013   HGB 15.3 10/03/2013   HCT 45.4 10/03/2013   PLT 265.0 10/03/2013   GLUCOSE 188 (H) 03/29/2017   CHOL 208 (H) 03/29/2017   TRIG 107.0 03/29/2017   HDL 43.90 03/29/2017   LDLDIRECT 144.0 10/03/2013   LDLCALC 142 (H) 03/29/2017   ALT 16 03/29/2017   AST 13 03/29/2017   NA 137 03/29/2017   K 3.7 03/29/2017   CL 101 03/29/2017   CREATININE 1.13 03/29/2017   BUN 12 03/29/2017   CO2 27 03/29/2017   TSH 2.23 10/01/2014   PSA 2.89 10/02/2012   HGBA1C 8.9 (H) 03/29/2017   MICROALBUR <0.7 10/07/2015    No results found.  Assessment & Plan:   Jose Pacheco was seen today for follow-up.  Diagnoses and all orders for this visit:  Controlled type 2 diabetes mellitus without complication, without long-term current use of insulin (HCC) -     Hepatic function panel; Future -     Hemoglobin A1c; Future -     metFORMIN (GLUMETZA) 1000 MG (MOD) 24 hr tablet; Take 1 tablet (1,000 mg total) by mouth daily with breakfast.  Essential hypertension -     Basic metabolic panel; Future -  nebivolol (BYSTOLIC) 10 MG tablet; Take 2 tablets (20 mg total) by mouth daily. -     Olmesartan-Amlodipine-HCTZ 40-10-25 MG TABS; Take 1 tablet by mouth daily.  Mixed hyperlipidemia -     Hepatic function panel; Future -     Lipid panel; Future  Localized edema   I have discontinued Jose Pacheco's nebivolol and metFORMIN. I have also changed his BYSTOLIC to nebivolol. Additionally, I am having him start on metFORMIN. Lastly, I am having him maintain his Olmesartan-Amlodipine-HCTZ.  Meds ordered this encounter  Medications  . metFORMIN (GLUMETZA) 1000 MG (MOD) 24 hr tablet    Sig: Take 1 tablet (1,000 mg total) by mouth daily with breakfast.    Dispense:  90 tablet    Refill:  1    Order Specific Question:   Supervising Provider     Answer:   Cassandria Anger [[id number]]  . nebivolol (BYSTOLIC) 10 MG tablet    Sig: Take 2 tablets (20 mg total) by mouth daily.    Dispense:  180 tablet    Refill:  1    Order Specific Question:   Supervising Provider    Answer:   Cassandria Anger [[id number]]  . Olmesartan-Amlodipine-HCTZ 40-10-25 MG TABS    Sig: Take 1 tablet by mouth daily.    Dispense:  90 tablet    Refill:  1    Order Specific Question:   Supervising Provider    Answer:   Cassandria Anger [[id number]]    Follow-up: Return in about 6 months (around 09/29/2017) for DM and HTN, hyperlipidemia with dr. Sharlet Salina.  Wilfred Lacy, NP

## 2017-04-06 ENCOUNTER — Other Ambulatory Visit: Payer: Self-pay | Admitting: Internal Medicine

## 2017-04-12 ENCOUNTER — Encounter: Payer: Self-pay | Admitting: Nurse Practitioner

## 2017-04-12 DIAGNOSIS — E119 Type 2 diabetes mellitus without complications: Secondary | ICD-10-CM

## 2017-04-13 MED ORDER — METFORMIN HCL ER (MOD) 1000 MG PO TB24: 1000.0000 mg | ORAL_TABLET | Freq: Two times a day (BID) | ORAL | 1 refills | Status: DC

## 2017-04-13 NOTE — Telephone Encounter (Signed)
Please advise 

## 2017-10-12 ENCOUNTER — Telehealth: Payer: Self-pay | Admitting: Internal Medicine

## 2017-10-12 NOTE — Telephone Encounter (Addendum)
Patient called to [phone], left VM to call back to the office to schedule a f/u appointment so bystolic can be filled. Last OV 03/29/17 noted by Alysia Pennaharlotte Nche f/u in 6 months around 09/29/17 for DM and HTN, hyperlipidemia with Dr. Okey Duprerawford, bystolic last filled on 03/29/17 180 tabs/1 refill.

## 2017-10-12 NOTE — Telephone Encounter (Signed)
Copied from CRM [phone]. Topic: Quick Communication - See Telephone Encounter >> Oct 12, 2017 11:03 AM Waymon AmatoBurton, Donna F wrote: CRM for notification. See Telephone encounter for: [facility] [place] [facility]   [phone]  Is requesting a refill on bystolic   Best number for pt is [phone] 10/12/17.

## 2017-10-15 ENCOUNTER — Other Ambulatory Visit: Payer: Self-pay

## 2017-10-15 DIAGNOSIS — I10 Essential (primary) hypertension: Secondary | ICD-10-CM

## 2017-10-15 MED ORDER — NEBIVOLOL HCL 10 MG PO TABS: 20.0000 mg | ORAL_TABLET | Freq: Every day | ORAL | 1 refills | Status: DC

## 2017-10-15 NOTE — Telephone Encounter (Signed)
PT called and said he has cpe scheduled 7/11 and can not schedule another apt before then.

## 2017-10-15 NOTE — Telephone Encounter (Signed)
Medication sent to pharmacy  

## 2017-10-22 ENCOUNTER — Other Ambulatory Visit: Payer: Self-pay | Admitting: Nurse Practitioner

## 2017-10-22 DIAGNOSIS — E119 Type 2 diabetes mellitus without complications: Secondary | ICD-10-CM

## 2017-11-12 ENCOUNTER — Encounter: Payer: Self-pay | Admitting: Internal Medicine

## 2017-11-12 ENCOUNTER — Other Ambulatory Visit: Payer: Self-pay | Admitting: Internal Medicine

## 2017-11-12 ENCOUNTER — Telehealth: Payer: Self-pay | Admitting: Internal Medicine

## 2017-11-12 DIAGNOSIS — I10 Essential (primary) hypertension: Secondary | ICD-10-CM

## 2017-11-12 NOTE — Telephone Encounter (Unsigned)
Copied from CRM [id number]. Topic: Quick Communication - See Telephone Encounter >> Nov 12, 2017  2:39 PM Waymon AmatoBurton, Donna F wrote: CRM for notification. See Telephone encounter for: [facility] [place] [phone] is requesting a refill on  olmesartan amlodipine hctz  40-10-25 strength    11/12/17.

## 2017-11-13 ENCOUNTER — Other Ambulatory Visit: Payer: Self-pay

## 2017-11-13 DIAGNOSIS — I10 Essential (primary) hypertension: Secondary | ICD-10-CM

## 2017-11-13 MED ORDER — OLMESARTAN-AMLODIPINE-HCTZ 40-10-25 MG PO TABS: 1.0000 | ORAL_TABLET | Freq: Every day | ORAL | 1 refills | Status: DC

## 2017-11-13 NOTE — Telephone Encounter (Signed)
Josetta HuddleGulch, Briana R, CMA  to Lutricia HorsfallAllen, Jacinto L       7:49 AM  [facility] [place]. Let me know if it needs to go to a different pharmacy.  Briana CMA(AAMA)    Last read by Lutricia Horsfallony L Louis at 8:04 AM on 11/13/2017.

## 2018-03-28 ENCOUNTER — Encounter: Payer: Self-pay | Admitting: Internal Medicine

## 2018-03-28 ENCOUNTER — Ambulatory Visit (INDEPENDENT_AMBULATORY_CARE_PROVIDER_SITE_OTHER): Payer: BLUE CROSS/BLUE SHIELD | Admitting: Internal Medicine

## 2018-03-28 ENCOUNTER — Other Ambulatory Visit (INDEPENDENT_AMBULATORY_CARE_PROVIDER_SITE_OTHER): Payer: BLUE CROSS/BLUE SHIELD

## 2018-03-28 VITALS — BP 130/80 | HR 63 | Temp 98.4°F | Ht 70.0 in | Wt 328.0 lb

## 2018-03-28 DIAGNOSIS — Z0000 Encounter for general adult medical examination without abnormal findings: Secondary | ICD-10-CM

## 2018-03-28 DIAGNOSIS — I10 Essential (primary) hypertension: Secondary | ICD-10-CM | POA: Diagnosis not present

## 2018-03-28 DIAGNOSIS — E119 Type 2 diabetes mellitus without complications: Secondary | ICD-10-CM | POA: Diagnosis not present

## 2018-03-28 DIAGNOSIS — E1165 Type 2 diabetes mellitus with hyperglycemia: Secondary | ICD-10-CM

## 2018-03-28 DIAGNOSIS — E782 Mixed hyperlipidemia: Secondary | ICD-10-CM | POA: Diagnosis not present

## 2018-03-28 LAB — CBC
HCT: 47.2 % (ref 39.0–52.0)
HEMOGLOBIN: 16 g/dL (ref 13.0–17.0)
MCHC: 33.9 g/dL (ref 30.0–36.0)
MCV: 81.3 fl (ref 78.0–100.0)
Platelets: 243 10*3/uL (ref 150.0–400.0)
RBC: 5.8 Mil/uL (ref 4.22–5.81)
RDW: 13.8 % (ref 11.5–15.5)
WBC: 6.5 10*3/uL (ref 4.0–10.5)

## 2018-03-28 LAB — LIPID PANEL
CHOLESTEROL: 199 mg/dL (ref 0–200)
HDL: 50.1 mg/dL (ref >39.00)
LDL Cholesterol: 129 mg/dL — ABNORMAL HIGH (ref 0–99)
NONHDL: 149.14
TRIGLYCERIDES: 101 mg/dL (ref 0.0–149.0)
Total CHOL/HDL Ratio: 4
VLDL: 20.2 mg/dL (ref 0.0–40.0)

## 2018-03-28 LAB — COMPREHENSIVE METABOLIC PANEL
ALBUMIN: 3.9 g/dL (ref 3.5–5.2)
ALK PHOS: 77 U/L (ref 39–117)
ALT: 17 U/L (ref 0–53)
AST: 12 U/L (ref 0–37)
BILIRUBIN TOTAL: 0.9 mg/dL (ref 0.2–1.2)
BUN: 12 mg/dL (ref 6–23)
CALCIUM: 9 mg/dL (ref 8.4–10.5)
CO2: 29 mEq/L (ref 19–32)
Chloride: 101 mEq/L (ref 96–112)
Creatinine, Ser: 1.05 mg/dL (ref 0.40–1.50)
GFR: 92.44 mL/min (ref >60.00)
GLUCOSE: 242 mg/dL — AB (ref 70–99)
POTASSIUM: 3.7 meq/L (ref 3.5–5.1)
Sodium: 138 mEq/L (ref 135–145)
TOTAL PROTEIN: 7.3 g/dL (ref 6.0–8.3)

## 2018-03-28 LAB — HEMOGLOBIN A1C: Hgb A1c MFr Bld: 10.3 % — ABNORMAL HIGH (ref 4.6–6.5)

## 2018-03-28 LAB — MICROALBUMIN / CREATININE URINE RATIO
Creatinine,U: 144.4 mg/dL
MICROALB/CREAT RATIO: 0.5 mg/g (ref 0.0–30.0)
Microalb, Ur: <0.7 mg/dL (ref 0.0–1.9)

## 2018-03-28 MED ORDER — ZOSTER VAC RECOMB ADJUVANTED 50 MCG/0.5ML IM SUSR: 0.5000 mL | Freq: Once | INTRAMUSCULAR | 1 refills | Status: AC

## 2018-03-28 NOTE — Progress Notes (Signed)
   Subjective:    Patient ID: Jose Pacheco, male    DOB: 21-Mar-1956, 62 y.o.   MRN: [id number]  HPI The patient is a 62 YO man coming in for physical. No new concerns.   [facility], [facility], social history reviewed and updated.   Review of Systems  Constitutional: Negative.   HENT: Negative.   Eyes: Negative.   Respiratory: Negative for cough, chest tightness and shortness of breath.   Cardiovascular: Negative for chest pain, palpitations and leg swelling.  Gastrointestinal: Negative for abdominal distention, abdominal pain, constipation, diarrhea, nausea and vomiting.  Musculoskeletal: Negative.   Skin: Negative.   Neurological: Negative.   Psychiatric/Behavioral: Negative.       Objective:   Physical Exam  Constitutional: He is oriented to person, place, and time. He appears well-developed and well-nourished.  HENT:  Head: Normocephalic and atraumatic.  Eyes: EOM are normal.  Neck: Normal range of motion.  Cardiovascular: Normal rate and regular rhythm.  Pulmonary/Chest: Effort normal and breath sounds normal. No respiratory distress. He has no wheezes. He has no rales.  Abdominal: Soft. Bowel sounds are normal. He exhibits no distension. There is no tenderness. There is no rebound.  Musculoskeletal: He exhibits no edema.  Neurological: He is alert and oriented to person, place, and time. Coordination normal.  Skin: Skin is warm and dry.  Psychiatric: He has a normal mood and affect.   Vitals:   03/28/18 0829 03/28/18 0902  BP: (!) 160/100 130/80  Pulse: 63   Temp: 98.4 F (36.9 C)   TempSrc: Oral   SpO2: 95%   Weight: (!) 328 lb (148.8 kg)   Height: 5\' 10"  (1.778 m)       Assessment & Plan:

## 2018-03-28 NOTE — Patient Instructions (Addendum)
Make sure to get an eye exam as you should have this yearly due to the diabetes.   Make sure to check the blood pressure once a week at home. Call us back with the readings.   The blood pressure should be <140 for the top number and <90 for the bottom number.   We are checking the labs today and will call you back if we need to make any changes with the sugars.    Diabetes Mellitus and Standards of Medical Care Managing diabetes (diabetes mellitus) can be complicated. Your diabetes treatment may be managed by a team of health care providers, including:  A diet and nutrition specialist (registered dietitian).  A nurse.  A certified diabetes educator (CDE).  A diabetes specialist (endocrinologist).  An eye doctor.  A primary care provider.  A dentist.  Your health care providers follow a schedule in order to help you get the best quality of care. The following schedule is a general guideline for your diabetes management plan. Your health care providers may also give you more specific instructions. HbA1c ( hemoglobin A1c) test This test provides information about blood sugar (glucose) control over the previous 2-3 months. It is used to check whether your diabetes management plan needs to be adjusted.  If you are meeting your treatment goals, this test is done at least 2 times a year.  If you are not meeting treatment goals or if your treatment goals have changed, this test is done 4 times a year.  Blood pressure test  This test is done at every routine medical visit. For most people, the goal is less than 130/80. Ask your health care provider what your goal blood pressure should be. Dental and eye exams  Visit your dentist two times a year.  If you have type 1 diabetes, get an eye exam 3-5 years after you are diagnosed, and then once a year after your first exam. ? If you were diagnosed with type 1 diabetes as a child, get an eye exam when you are age 65 or older and have had  diabetes for 3-5 years. After the first exam, you should get an eye exam once a year.  If you have type 2 diabetes, have an eye exam as soon as you are diagnosed, and then once a year after your first exam. Foot care exam  Visual foot exams are done at every routine medical visit. The exams check for cuts, bruises, redness, blisters, sores, or other problems with the feet.  A complete foot exam is done by your health care provider once a year. This exam includes an inspection of the structure and skin of your feet, and a check of the pulses and sensation in your feet. ? Type 1 diabetes: Get your first exam 3-5 years after diagnosis. ? Type 2 diabetes: Get your first exam as soon as you are diagnosed.  Check your feet every day for cuts, bruises, redness, blisters, or sores. If you have any of these or other problems that are not healing, contact your health care provider. Kidney function test ( urine microalbumin)  This test is done once a year. ? Type 1 diabetes: Get your first test 5 years after diagnosis. ? Type 2 diabetes: Get your first test as soon as you are diagnosed.  If you have chronic kidney disease (CKD), get a serum creatinine and estimated glomerular filtration rate (eGFR) test once a year. Lipid profile (cholesterol, HDL, LDL, triglycerides)  This test should be  done when you are diagnosed with diabetes, and every 5 years after the first test. If you are on medicines to lower your cholesterol, you may need to get this test done every year. ? The goal for LDL is less than 100 mg/dL (5.5 mmol/L). If you are at high risk, the goal is less than 70 mg/dL (3.9 mmol/L). ? The goal for HDL is 40 mg/dL (2.2 mmol/L) for men and 50 mg/dL(2.8 mmol/L) for women. An HDL cholesterol of 60 mg/dL (3.3 mmol/L) or higher gives some protection against heart disease. ? The goal for triglycerides is less than 150 mg/dL (8.3 mmol/L). Immunizations  The yearly flu (influenza) vaccine is  recommended for everyone 6 months or older who has diabetes.  The pneumonia (pneumococcal) vaccine is recommended for everyone 2 years or older who has diabetes. If you are 53 or older, you may get the pneumonia vaccine as a series of two separate shots.  The hepatitis B vaccine is recommended for adults shortly after they have been diagnosed with diabetes.  The Tdap (tetanus, diphtheria, and pertussis) vaccine should be given: ? According to normal childhood vaccination schedules, for children. ? Every 10 years, for adults who have diabetes.  The shingles vaccine is recommended for people who have had chicken pox and are 50 years or older. Mental and emotional health  Screening for symptoms of eating disorders, anxiety, and depression is recommended at the time of diagnosis and afterward as needed. If your screening shows that you have symptoms (you have a positive screening result), you may need further evaluation and be referred to a mental health care provider. Diabetes self-management education  Education about how to manage your diabetes is recommended at diagnosis and ongoing as needed. Treatment plan  Your treatment plan will be reviewed at every medical visit. Summary  Managing diabetes (diabetes mellitus) can be complicated. Your diabetes treatment may be managed by a team of health care providers.  Your health care providers follow a schedule in order to help you get the best quality of care.  Standards of care including having regular physical exams, blood tests, blood pressure monitoring, immunizations, screening tests, and education about how to manage your diabetes.  Your health care providers may also give you more specific instructions based on your individual health. This information is not intended to replace advice given to you by your health care provider. Make sure you discuss any questions you have with your health care provider. Document Released: 07/02/2009  Document Revised: 06/02/2016 Document Reviewed: 06/02/2016 Elsevier Interactive Patient Education  2018 [place] Maintenance, Male A healthy lifestyle and preventive care is important for your health and wellness. Ask your health care provider about what schedule of regular examinations is right for you. What should I know about weight and diet? Eat a Healthy Diet  Eat plenty of vegetables, fruits, whole grains, low-fat dairy products, and lean protein.  Do not eat a lot of foods high in solid fats, added sugars, or salt.  Maintain a Healthy Weight Regular exercise can help you achieve or maintain a healthy weight. You should:  Do at least 150 minutes of exercise each week. The exercise should increase your heart rate and make you sweat (moderate-intensity exercise).  Do strength-training exercises at least twice a week.  Watch Your Levels of Cholesterol and Blood Lipids  Have your blood tested for lipids and cholesterol every 5 years starting at 61 years of age. If you are at high risk for  heart disease, you should start having your blood tested when you are 61 years old. You may need to have your cholesterol levels checked more often if: ? Your lipid or cholesterol levels are high. ? You are older than 61 years of age. ? You are at high risk for heart disease.  What should I know about cancer screening? Many types of cancers can be detected early and may often be prevented. Lung Cancer  You should be screened every year for lung cancer if: ? You are a current smoker who has smoked for at least 30 years. ? You are a former smoker who has quit within the past 15 years.  Talk to your health care provider about your screening options, when you should start screening, and how often you should be screened.  Colorectal Cancer  Routine colorectal cancer screening usually begins at 61 years of age and should be repeated every 5-10 years until you are 61 years old. You  may need to be screened more often if early forms of precancerous polyps or small growths are found. Your health care provider may recommend screening at an earlier age if you have risk factors for colon cancer.  Your health care provider may recommend using home test kits to check for hidden blood in the stool.  A small camera at the end of a tube can be used to examine your colon (sigmoidoscopy or colonoscopy). This checks for the earliest forms of colorectal cancer.  Prostate and Testicular Cancer  Depending on your age and overall health, your health care provider may do certain tests to screen for prostate and testicular cancer.  Talk to your health care provider about any symptoms or concerns you have about testicular or prostate cancer.  Skin Cancer  Check your skin from head to toe regularly.  Tell your health care provider about any new moles or changes in moles, especially if: ? There is a change in a mole's size, shape, or color. ? You have a mole that is larger than a pencil eraser.  Always use sunscreen. Apply sunscreen liberally and repeat throughout the day.  Protect yourself by wearing long sleeves, pants, a wide-brimmed hat, and sunglasses when outside.  What should I know about heart disease, diabetes, and high blood pressure?  If you are 103-47 years of age, have your blood pressure checked every 3-5 years. If you are 56 years of age or older, have your blood pressure checked every year. You should have your blood pressure measured twice-once when you are at a hospital or clinic, and once when you are not at a hospital or clinic. Record the average of the two measurements. To check your blood pressure when you are not at a hospital or clinic, you can use: ? An automated blood pressure machine at a pharmacy. ? A home blood pressure monitor.  Talk to your health care provider about your target blood pressure.  If you are between 77-6 years old, ask your health care  provider if you should take aspirin to prevent heart disease.  Have regular diabetes screenings by checking your fasting blood sugar level. ? If you are at a normal weight and have a low risk for diabetes, have this test once every three years after the age of 53. ? If you are overweight and have a high risk for diabetes, consider being tested at a younger age or more often.  A one-time screening for abdominal aortic aneurysm (AAA) by ultrasound is recommended for  men aged 20-75 years who are current or former smokers. What should I know about preventing infection? Hepatitis B If you have a higher risk for hepatitis B, you should be screened for this virus. Talk with your health care provider to find out if you are at risk for hepatitis B infection. Hepatitis C Blood testing is recommended for:  Everyone born from [birth year] through 1964.  Anyone with known risk factors for hepatitis C.  Sexually Transmitted Diseases (STDs)  You should be screened each year for STDs including gonorrhea and chlamydia if: ? You are sexually active and are younger than 61 years of age. ? You are older than 61 years of age and your health care provider tells you that you are at risk for this type of infection. ? Your sexual activity has changed since you were last screened and you are at an increased risk for chlamydia or gonorrhea. Ask your health care provider if you are at risk.  Talk with your health care provider about whether you are at high risk of being infected with HIV. Your health care provider may recommend a prescription medicine to help prevent HIV infection.  What else can I do?  Schedule regular health, dental, and eye exams.  Stay current with your vaccines (immunizations).  Do not use any tobacco products, such as cigarettes, chewing tobacco, and e-cigarettes. If you need help quitting, ask your health care provider.  Limit alcohol intake to no more than 2 drinks per day. One drink equals 12  ounces of beer, 5 ounces of wine, or 1 ounces of hard liquor.  Do not use street drugs.  Do not share needles.  Ask your health care provider for help if you need support or information about quitting drugs.  Tell your health care provider if you often feel depressed.  Tell your health care provider if you have ever been abused or do not feel safe at home. This information is not intended to replace advice given to you by your health care provider. Make sure you discuss any questions you have with your health care provider. Document Released: 03/02/2008 Document Revised: 05/03/2016 Document Reviewed: 06/08/2015 Elsevier Interactive Patient Education  [employer].

## 2018-03-29 NOTE — Assessment & Plan Note (Signed)
BP initially high but with recheck is normal. Will not adjust regimen today. Taking bystolic and omlesartan/hctz/amlodipine max dose. Checking CMP and adjust as needed.

## 2018-03-29 NOTE — Assessment & Plan Note (Signed)
Taking metformin 1000 mg daily (not as instructed) and HgA1c was above goal last visit. Checking HgA1c and if high needs visit in 3 months and needs adjustment. Metformin is not causing side effects. Taking ARB. Foot exam done today. Reminded about eye exam.

## 2018-03-29 NOTE — Assessment & Plan Note (Signed)
Checking lipid panel and adjust as needed.  

## 2018-03-29 NOTE — Assessment & Plan Note (Addendum)
Colonoscopy up to date, declines pneumonia. Reminded about yearly flu shot. Tetanus up to date. Counseled about sun safety and mole surveillance. Given screening recommendations. Rx for shingrix given at visit.

## 2018-04-11 ENCOUNTER — Encounter: Payer: Self-pay | Admitting: Internal Medicine

## 2018-04-15 ENCOUNTER — Other Ambulatory Visit: Payer: Self-pay

## 2018-04-15 MED ORDER — GLUCOSE BLOOD VI STRP: ORAL_STRIP | 1 refills | Status: DC

## 2018-04-15 MED ORDER — LANCETS MISC: 1 refills | Status: AC

## 2018-04-16 ENCOUNTER — Other Ambulatory Visit: Payer: Self-pay | Admitting: Internal Medicine

## 2018-04-16 DIAGNOSIS — I10 Essential (primary) hypertension: Secondary | ICD-10-CM

## 2018-04-25 ENCOUNTER — Other Ambulatory Visit: Payer: Self-pay | Admitting: Internal Medicine

## 2018-04-25 ENCOUNTER — Encounter: Payer: Self-pay | Admitting: Internal Medicine

## 2018-04-25 DIAGNOSIS — E119 Type 2 diabetes mellitus without complications: Secondary | ICD-10-CM

## 2018-04-25 DIAGNOSIS — I10 Essential (primary) hypertension: Secondary | ICD-10-CM

## 2018-04-26 MED ORDER — METFORMIN HCL ER 500 MG PO TB24: 1000.0000 mg | ORAL_TABLET | Freq: Two times a day (BID) | ORAL | 3 refills | Status: DC

## 2018-04-26 MED ORDER — OLMESARTAN-AMLODIPINE-HCTZ 40-10-25 MG PO TABS: 1.0000 | ORAL_TABLET | Freq: Every day | ORAL | 3 refills | Status: DC

## 2018-04-26 MED ORDER — METFORMIN HCL ER (MOD) 1000 MG PO TB24: 1000.0000 mg | ORAL_TABLET | Freq: Two times a day (BID) | ORAL | 3 refills | Status: DC

## 2018-04-26 NOTE — Telephone Encounter (Signed)
Pharmacy called and states the GLUMETZA  Is not covered by insurance but the GLUCOPHAGE XR is covered by insurance, they would like this changed.  Please advise   [facility] [place]

## 2018-04-26 NOTE — Addendum Note (Signed)
Addended by: Hillard DankerRAWFORD, Bessie Livingood A on: 04/26/2018 03:19 PM   Modules accepted: Orders

## 2018-06-14 ENCOUNTER — Telehealth: Payer: Self-pay

## 2018-06-14 DIAGNOSIS — Z1211 Encounter for screening for malignant neoplasm of colon: Secondary | ICD-10-CM

## 2018-06-14 NOTE — Telephone Encounter (Signed)
Pt is due for cologuard - sending to assistant for assistance.  

## 2018-06-18 NOTE — Telephone Encounter (Signed)
fine

## 2018-06-18 NOTE — Telephone Encounter (Signed)
Is it okay to order cologuard?  

## 2018-06-19 LAB — COLOGUARD

## 2018-06-19 NOTE — Telephone Encounter (Signed)
ordered

## 2018-06-24 ENCOUNTER — Telehealth: Payer: Self-pay | Admitting: Internal Medicine

## 2018-06-24 MED ORDER — LANCETS MISC: 1 refills | Status: AC

## 2018-06-24 MED ORDER — GLUCOSE BLOOD VI STRP: ORAL_STRIP | 1 refills | Status: DC

## 2018-06-24 MED ORDER — ACCU-CHEK COMBO KIT: PACK | 0 refills | Status: DC

## 2018-06-24 MED ORDER — BLOOD GLUCOSE METER KIT: PACK | 0 refills | Status: AC

## 2018-06-24 NOTE — Telephone Encounter (Signed)
rx sent

## 2018-06-24 NOTE — Telephone Encounter (Signed)
Copied from CRM [id number]. Topic: Quick Communication - Rx Refill/Question >> Jun 24, 2018 10:55 AM Vivia Ewing A wrote: Medication: glucose blood test strip   Lancets MISC **Pt's insurance coverage has changed to the AccuCheck System, [facility] requesting [facility] RX be changed to AccuCheck meter, Accut check lancet, and test strips (guide or avia plus-no preference)  Has the patient contacted their pharmacy? Yes.   (Agent: If no, request that the patient contact the pharmacy for the refill.) (Agent: If yes, when and what did the pharmacy advise?)  Preferred Pharmacy (with phone number or street name): [id number] [place], [place] - [address] [place] [address] [phone] (Phone) [phone] (Fax)   Agent: Please be advised that RX refills may take up to 3 business days. We ask that you follow-up with your pharmacy.

## 2018-09-25 ENCOUNTER — Other Ambulatory Visit: Payer: Self-pay | Admitting: Internal Medicine

## 2018-10-02 LAB — COLOGUARD: COLOGUARD: NEGATIVE

## 2018-10-03 ENCOUNTER — Encounter: Payer: Self-pay | Admitting: Internal Medicine

## 2018-10-03 NOTE — Progress Notes (Signed)
Abstracted and sent to scan  

## 2018-10-18 ENCOUNTER — Other Ambulatory Visit: Payer: Self-pay | Admitting: Internal Medicine

## 2018-10-18 DIAGNOSIS — I10 Essential (primary) hypertension: Secondary | ICD-10-CM

## 2019-01-06 ENCOUNTER — Other Ambulatory Visit: Payer: Self-pay | Admitting: Internal Medicine

## 2019-01-14 ENCOUNTER — Other Ambulatory Visit: Payer: Self-pay | Admitting: Internal Medicine

## 2019-01-14 DIAGNOSIS — I10 Essential (primary) hypertension: Secondary | ICD-10-CM

## 2019-03-19 ENCOUNTER — Encounter: Payer: Self-pay | Admitting: Internal Medicine

## 2019-03-27 ENCOUNTER — Encounter: Payer: Self-pay | Admitting: Internal Medicine

## 2019-03-27 ENCOUNTER — Ambulatory Visit (INDEPENDENT_AMBULATORY_CARE_PROVIDER_SITE_OTHER): Payer: BC Managed Care – PPO | Admitting: Internal Medicine

## 2019-03-27 ENCOUNTER — Other Ambulatory Visit: Payer: Self-pay

## 2019-03-27 ENCOUNTER — Other Ambulatory Visit (INDEPENDENT_AMBULATORY_CARE_PROVIDER_SITE_OTHER): Payer: BC Managed Care – PPO

## 2019-03-27 VITALS — BP 130/90 | HR 58 | Temp 98.8°F | Ht 70.0 in | Wt 323.0 lb

## 2019-03-27 DIAGNOSIS — Z0000 Encounter for general adult medical examination without abnormal findings: Secondary | ICD-10-CM

## 2019-03-27 DIAGNOSIS — E1169 Type 2 diabetes mellitus with other specified complication: Secondary | ICD-10-CM

## 2019-03-27 DIAGNOSIS — E1165 Type 2 diabetes mellitus with hyperglycemia: Secondary | ICD-10-CM

## 2019-03-27 DIAGNOSIS — I10 Essential (primary) hypertension: Secondary | ICD-10-CM

## 2019-03-27 DIAGNOSIS — E785 Hyperlipidemia, unspecified: Secondary | ICD-10-CM

## 2019-03-27 LAB — COMPREHENSIVE METABOLIC PANEL
ALT: 15 U/L (ref 0–53)
AST: 13 U/L (ref 0–37)
Albumin: 4.2 g/dL (ref 3.5–5.2)
Alkaline Phosphatase: 64 U/L (ref 39–117)
BUN: 15 mg/dL (ref 6–23)
CO2: 24 mEq/L (ref 19–32)
Calcium: 9 mg/dL (ref 8.4–10.5)
Chloride: 102 mEq/L (ref 96–112)
Creatinine, Ser: 1.15 mg/dL (ref 0.40–1.50)
GFR: 78.05 mL/min (ref >60.00)
Glucose, Bld: 138 mg/dL — ABNORMAL HIGH (ref 70–99)
Potassium: 3.5 mEq/L (ref 3.5–5.1)
Sodium: 139 mEq/L (ref 135–145)
Total Bilirubin: 1 mg/dL (ref 0.2–1.2)
Total Protein: 7.4 g/dL (ref 6.0–8.3)

## 2019-03-27 LAB — MICROALBUMIN / CREATININE URINE RATIO
Creatinine,U: 109.5 mg/dL
Microalb Creat Ratio: 0.6 mg/g (ref 0.0–30.0)
Microalb, Ur: <0.7 mg/dL (ref 0.0–1.9)

## 2019-03-27 LAB — CBC
HCT: 46.7 % (ref 39.0–52.0)
Hemoglobin: 15.6 g/dL (ref 13.0–17.0)
MCHC: 33.3 g/dL (ref 30.0–36.0)
MCV: 82.3 fl (ref 78.0–100.0)
Platelets: 280 10*3/uL (ref 150.0–400.0)
RBC: 5.68 Mil/uL (ref 4.22–5.81)
RDW: 13.7 % (ref 11.5–15.5)
WBC: 7.1 10*3/uL (ref 4.0–10.5)

## 2019-03-27 LAB — LIPID PANEL
Cholesterol: 203 mg/dL — ABNORMAL HIGH (ref 0–200)
HDL: 44 mg/dL (ref >39.00)
LDL Cholesterol: 136 mg/dL — ABNORMAL HIGH (ref 0–99)
NonHDL: 158.86
Total CHOL/HDL Ratio: 5
Triglycerides: 112 mg/dL (ref 0.0–149.0)
VLDL: 22.4 mg/dL (ref 0.0–40.0)

## 2019-03-27 LAB — HEMOGLOBIN A1C: Hgb A1c MFr Bld: 7 % — ABNORMAL HIGH (ref 4.6–6.5)

## 2019-03-27 NOTE — Patient Instructions (Signed)
Diabetes Mellitus and Standards of Medical Care Managing diabetes (diabetes mellitus) can be complicated. Your diabetes treatment may be managed by a team of health care providers, including:  A physician who specializes in diabetes (endocrinologist).  A nurse practitioner or physician assistant.  Nurses.  A diet and nutrition specialist (registered dietitian).  A certified diabetes educator (CDE).  An exercise specialist.  A pharmacist.  An eye doctor.  A foot specialist (podiatrist).  A dentist.  A primary care provider.  A mental health provider. Your health care providers follow guidelines to help you get the best quality of care. The following schedule is a general guideline for your diabetes management plan. Your health care providers may give you more specific instructions. Physical exams Upon being diagnosed with diabetes mellitus, and each year after that, your health care provider will ask about your medical and family history. He or she will also do a physical exam. Your exam may include:  Measuring your height, weight, and body mass index (BMI).  Checking your blood pressure. This will be done at every routine medical visit. Your target blood pressure may vary depending on your medical conditions, your age, and other factors.  Thyroid gland exam.  Skin exam.  Screening for damage to your nerves (peripheral neuropathy). This may include checking the pulse in your legs and feet and checking the level of sensation in your hands and feet.  A complete foot exam to inspect the structure and skin of your feet, including checking for cuts, bruises, redness, blisters, sores, or other problems.  Screening for blood vessel (vascular) problems, which may include checking the pulse in your legs and feet and checking your temperature. Blood tests Depending on your treatment plan and your personal needs, you may have the following tests done:  HbA1c (hemoglobin A1c). This  test provides information about blood sugar (glucose) control over the previous 2-3 months. It is used to adjust your treatment plan, if needed. This test will be done: ? At least 2 times a year, if you are meeting your treatment goals. ? 4 times a year, if you are not meeting your treatment goals or if treatment goals have changed.  Lipid testing, including total, LDL, and HDL cholesterol and triglyceride levels. ? The goal for LDL is less than 100 mg/dL (5.5 mmol/L). If you are at high risk for complications, the goal is less than 70 mg/dL (3.9 mmol/L). ? The goal for HDL is 40 mg/dL (2.2 mmol/L) or higher for men and 50 mg/dL (2.8 mmol/L) or higher for women. An HDL cholesterol of 60 mg/dL (3.3 mmol/L) or higher gives some protection against heart disease. ? The goal for triglycerides is less than 150 mg/dL (8.3 mmol/L).  Liver function tests.  Kidney function tests.  Thyroid function tests. Dental and eye exams  Visit your dentist two times a year.  If you have type 1 diabetes, your health care provider may recommend an eye exam 3-5 years after you are diagnosed, and then once a year after your first exam. ? For children with type 1 diabetes, a health care provider may recommend an eye exam when your child is age 10 or older and has had diabetes for 3-5 years. After the first exam, your child should get an eye exam once a year.  If you have type 2 diabetes, your health care provider may recommend an eye exam as soon as you are diagnosed, and then once a year after your first exam. Immunizations   The   yearly flu (influenza) vaccine is recommended for everyone 6 months or older who has diabetes.  The pneumonia (pneumococcal) vaccine is recommended for everyone 2 years or older who has diabetes. If you are 65 or older, you may get the pneumonia vaccine as a series of two separate shots.  The hepatitis B vaccine is recommended for adults shortly after being diagnosed with diabetes.   Adults and children with diabetes should receive all other vaccines according to age-specific recommendations from the Centers for Disease Control and Prevention (CDC). Mental and emotional health Screening for symptoms of eating disorders, anxiety, and depression is recommended at the time of diagnosis and afterward as needed. If your screening shows that you have symptoms (positive screening result), you may need more evaluation and you may work with a mental health care provider. Treatment plan Your treatment plan will be reviewed at every medical visit. You and your health care provider will discuss:  How you are taking your medicines, including insulin.  Any side effects you are experiencing.  Your blood glucose target goals.  The frequency of your blood glucose monitoring.  Lifestyle habits, such as activity level as well as tobacco, alcohol, and substance use. Diabetes self-management education Your health care provider will assess how well you are monitoring your blood glucose levels and whether you are taking your insulin correctly. He or she may refer you to:  A certified diabetes educator to manage your diabetes throughout your life, starting at diagnosis.  A registered dietitian who can create or review your personal nutrition plan.  An exercise specialist who can discuss your activity level and exercise plan. Summary  Managing diabetes (diabetes mellitus) can be complicated. Your diabetes treatment may be managed by a team of health care providers.  Your health care providers follow guidelines in order to help you get the best quality of care.  Standards of care including having regular physical exams, blood tests, blood pressure monitoring, immunizations, screening tests, and education about how to manage your diabetes.  Your health care providers may also give you more specific instructions based on your individual health. This information is not intended to replace  advice given to you by your health care provider. Make sure you discuss any questions you have with your health care provider. Document Released: 07/02/2009 Document Revised: 05/24/2018 Document Reviewed: 06/02/2016 Elsevier Patient Education  2020 [employer]  

## 2019-03-27 NOTE — Addendum Note (Signed)
Addended by: Pricilla Holm A on: 03/27/2019 08:42 AM   Modules accepted: Level of Service

## 2019-03-27 NOTE — Assessment & Plan Note (Signed)
Taking metformin xr 1000 mg daily, on ARB not on statin. Checking HgA1c, advised about eye exam. Foot exam done. Talked to patient about finding a local healthcare provider as he is unable to come for visits with [place] more than once a year due to living so far away and his health is not doing well in regard to control of his diabetes. Adjust regimen as needed and counseled if he is not willing to find another provider he needs more frequent follow up.

## 2019-03-27 NOTE — Progress Notes (Addendum)
   Subjective:   Patient ID: Jose Pacheco, male    DOB: 1956-05-06, 63 y.o.   MRN: [id number]  HPI The patient is a 63 YO man coming in for follow up of his diabetes (poorly controlled, taking metformin, on ARB, denies numbness or burning pain in his feet, did not return for follow up last year as instructed, lives in [place] and still has primary care in [place], does not return here except for healthcare now, denies side effects, not checking sugars much lately, down 5 pounds since last year), and morbid obesity (lost about 5 pounds since last year, is working on lower carb options for meals, not exercising currently) and blood pressure (taking olmesartan/hctz/amlodipine and bystolic, denies side effects, denies headaches or chest pains).   Review of Systems  Constitutional: Negative.   HENT: Negative.   Eyes: Negative.   Respiratory: Negative for cough, chest tightness and shortness of breath.   Cardiovascular: Negative for chest pain, palpitations and leg swelling.  Gastrointestinal: Negative for abdominal distention, abdominal pain, constipation, diarrhea, nausea and vomiting.  Musculoskeletal: Negative.   Skin: Negative.   Neurological: Negative.   Psychiatric/Behavioral: Negative.     Objective:  Physical Exam Constitutional:      Appearance: He is well-developed. He is obese.  HENT:     Head: Normocephalic and atraumatic.  Neck:     Musculoskeletal: Normal range of motion.  Cardiovascular:     Rate and Rhythm: Normal rate and regular rhythm.  Pulmonary:     Effort: Pulmonary effort is normal. No respiratory distress.     Breath sounds: Normal breath sounds. No wheezing or rales.  Abdominal:     General: Bowel sounds are normal. There is no distension.     Palpations: Abdomen is soft.     Tenderness: There is no abdominal tenderness. There is no rebound.  Skin:    General: Skin is warm and dry.     Comments: Foot exam done  Neurological:     Mental Status: He is alert and  oriented to person, place, and time.     Coordination: Coordination normal.     Vitals:   03/27/19 0759  BP: 130/90  Pulse: (!) 58  Temp: 98.8 F (37.1 C)  TempSrc: Oral  SpO2: 97%  Weight: (!) 323 lb (146.5 kg)  Height: 5\' 10"  (1.778 m)    Assessment & Plan:  Visit time 40 minutes: greater than 50% of that time was spent in face to face counseling and coordination of care with the patient: counseled about the uncontrolled nature of his diabetes as well as the need for more follow up to help this improve and potential wisdom of having a doctor closer to his home so that he has assess to medical care reasonably, as well as potential harm and complication from continued uncontrolled medical problems

## 2019-03-27 NOTE — Assessment & Plan Note (Signed)
BP at goal on amlodipine/olmesartan/hctz and bystolic. Checking CMP and adjust as needed.

## 2019-03-27 NOTE — Assessment & Plan Note (Signed)
BMI 46 and complicated by diabetes, hypertension, hyperlipidemia.

## 2019-03-27 NOTE — Assessment & Plan Note (Signed)
Checking lipid panel and adjust as needed. Allergy reported to statins and he does not want to take any more of these.

## 2019-03-28 ENCOUNTER — Encounter: Payer: Self-pay | Admitting: Internal Medicine

## 2019-03-28 LAB — HEPATITIS C ANTIBODY
Hepatitis C Ab: NONREACTIVE
SIGNAL TO CUT-OFF: 0.02 (ref <1.00)

## 2019-03-30 ENCOUNTER — Encounter: Payer: Self-pay | Admitting: Internal Medicine

## 2019-03-31 MED ORDER — GLUCOSE BLOOD VI STRP: ORAL_STRIP | 3 refills | Status: DC

## 2019-04-11 ENCOUNTER — Other Ambulatory Visit: Payer: Self-pay | Admitting: Internal Medicine

## 2019-04-11 DIAGNOSIS — I10 Essential (primary) hypertension: Secondary | ICD-10-CM

## 2019-04-13 ENCOUNTER — Other Ambulatory Visit: Payer: Self-pay | Admitting: Internal Medicine

## 2019-04-13 DIAGNOSIS — I10 Essential (primary) hypertension: Secondary | ICD-10-CM

## 2019-07-15 ENCOUNTER — Other Ambulatory Visit: Payer: Self-pay | Admitting: Internal Medicine

## 2019-10-14 ENCOUNTER — Other Ambulatory Visit: Payer: Self-pay | Admitting: Internal Medicine

## 2019-10-14 DIAGNOSIS — I10 Essential (primary) hypertension: Secondary | ICD-10-CM

## 2019-11-03 ENCOUNTER — Encounter: Payer: Self-pay | Admitting: Internal Medicine

## 2019-11-08 ENCOUNTER — Other Ambulatory Visit: Payer: Self-pay | Admitting: Internal Medicine

## 2020-03-24 ENCOUNTER — Ambulatory Visit (INDEPENDENT_AMBULATORY_CARE_PROVIDER_SITE_OTHER): Payer: BC Managed Care – PPO | Admitting: Internal Medicine

## 2020-03-24 ENCOUNTER — Other Ambulatory Visit: Payer: Self-pay

## 2020-03-24 ENCOUNTER — Encounter: Payer: Self-pay | Admitting: Internal Medicine

## 2020-03-24 VITALS — BP 136/88 | HR 63 | Temp 98.3°F | Ht 70.0 in | Wt 319.0 lb

## 2020-03-24 DIAGNOSIS — E1165 Type 2 diabetes mellitus with hyperglycemia: Secondary | ICD-10-CM | POA: Diagnosis not present

## 2020-03-24 DIAGNOSIS — I10 Essential (primary) hypertension: Secondary | ICD-10-CM

## 2020-03-24 DIAGNOSIS — Z0000 Encounter for general adult medical examination without abnormal findings: Secondary | ICD-10-CM

## 2020-03-24 DIAGNOSIS — E785 Hyperlipidemia, unspecified: Secondary | ICD-10-CM

## 2020-03-24 DIAGNOSIS — E1169 Type 2 diabetes mellitus with other specified complication: Secondary | ICD-10-CM | POA: Diagnosis not present

## 2020-03-24 LAB — COMPREHENSIVE METABOLIC PANEL
ALT: 16 U/L (ref 0–53)
AST: 12 U/L (ref 0–37)
Albumin: 4.1 g/dL (ref 3.5–5.2)
Alkaline Phosphatase: 63 U/L (ref 39–117)
BUN: 18 mg/dL (ref 6–23)
CO2: 28 mEq/L (ref 19–32)
Calcium: 9.3 mg/dL (ref 8.4–10.5)
Chloride: 104 mEq/L (ref 96–112)
Creatinine, Ser: 1.11 mg/dL (ref 0.40–1.50)
GFR: 81.04 mL/min (ref >60.00)
Glucose, Bld: 150 mg/dL — ABNORMAL HIGH (ref 70–99)
Potassium: 3.7 mEq/L (ref 3.5–5.1)
Sodium: 141 mEq/L (ref 135–145)
Total Bilirubin: 0.6 mg/dL (ref 0.2–1.2)
Total Protein: 7 g/dL (ref 6.0–8.3)

## 2020-03-24 LAB — CBC
HCT: 44.5 % (ref 39.0–52.0)
Hemoglobin: 15.2 g/dL (ref 13.0–17.0)
MCHC: 34.2 g/dL (ref 30.0–36.0)
MCV: 81.2 fl (ref 78.0–100.0)
Platelets: 304 10*3/uL (ref 150.0–400.0)
RBC: 5.47 Mil/uL (ref 4.22–5.81)
RDW: 14.5 % (ref 11.5–15.5)
WBC: 6.7 10*3/uL (ref 4.0–10.5)

## 2020-03-24 LAB — HEMOGLOBIN A1C: Hgb A1c MFr Bld: 6.9 % — ABNORMAL HIGH (ref 4.6–6.5)

## 2020-03-24 LAB — LIPID PANEL
Cholesterol: 190 mg/dL (ref 0–200)
HDL: 43.3 mg/dL (ref >39.00)
LDL Cholesterol: 127 mg/dL — ABNORMAL HIGH (ref 0–99)
NonHDL: 146.28
Total CHOL/HDL Ratio: 4
Triglycerides: 96 mg/dL (ref 0.0–149.0)
VLDL: 19.2 mg/dL (ref 0.0–40.0)

## 2020-03-24 LAB — MICROALBUMIN / CREATININE URINE RATIO
Creatinine,U: 129.2 mg/dL
Microalb Creat Ratio: 0.5 mg/g (ref 0.0–30.0)
Microalb, Ur: <0.7 mg/dL (ref 0.0–1.9)

## 2020-03-24 NOTE — Progress Notes (Signed)
° °  Subjective:   Patient ID: Jose Pacheco, male    DOB: 16-Jan-1956, 64 y.o.   MRN: [id number]  HPI The patient is a 64 YO man coming coming in for physical.   PMH, FMH, social history reviewed and updated.   Review of Systems  Constitutional: Negative.   HENT: Negative.   Eyes: Negative.   Respiratory: Negative for cough, chest tightness and shortness of breath.   Cardiovascular: Negative for chest pain, palpitations and leg swelling.  Gastrointestinal: Negative for abdominal distention, abdominal pain, constipation, diarrhea, nausea and vomiting.  Musculoskeletal: Negative.   Skin: Negative.   Neurological: Negative.   Psychiatric/Behavioral: Negative.     Objective:  Physical Exam Constitutional:      Appearance: He is well-developed. He is obese.  HENT:     Head: Normocephalic and atraumatic.  Cardiovascular:     Rate and Rhythm: Normal rate and regular rhythm.  Pulmonary:     Effort: Pulmonary effort is normal. No respiratory distress.     Breath sounds: Normal breath sounds. No wheezing or rales.  Abdominal:     General: Bowel sounds are normal. There is no distension.     Palpations: Abdomen is soft.     Tenderness: There is no abdominal tenderness. There is no rebound.  Musculoskeletal:     Cervical back: Normal range of motion.  Skin:    General: Skin is warm and dry.     Comments: Foot exam done  Neurological:     Mental Status: He is alert and oriented to person, place, and time.     Coordination: Coordination normal.     Vitals:   03/24/20 0842  BP: 136/88  Pulse: 63  Temp: 98.3 F (36.8 C)  TempSrc: Oral  SpO2: 98%  Weight: (!) 319 lb (144.7 kg)  Height: 5\' 10"  (1.778 m)    This visit occurred during the SARS-CoV-2 public health emergency.  Safety protocols were in place, including screening questions prior to the visit, additional usage of staff PPE, and extensive cleaning of exam room while observing appropriate contact time as indicated for  disinfecting solutions.   Assessment & Plan:

## 2020-03-24 NOTE — Patient Instructions (Addendum)
We will check the blood work today and call you back about the results.    Health Maintenance, Male Adopting a healthy lifestyle and getting preventive care are important in promoting health and wellness. Ask your health care provider about:  The right schedule for you to have regular tests and exams.  Things you can do on your own to prevent diseases and keep yourself healthy. What should I know about diet, weight, and exercise? Eat a healthy diet   Eat a diet that includes plenty of vegetables, fruits, low-fat dairy products, and lean protein.  Do not eat a lot of foods that are high in solid fats, added sugars, or sodium. Maintain a healthy weight Body mass index (BMI) is a measurement that can be used to identify possible weight problems. It estimates body fat based on height and weight. Your health care provider can help determine your BMI and help you achieve or maintain a healthy weight. Get regular exercise Get regular exercise. This is one of the most important things you can do for your health. Most adults should:  Exercise for at least 150 minutes each week. The exercise should increase your heart rate and make you sweat (moderate-intensity exercise).  Do strengthening exercises at least twice a week. This is in addition to the moderate-intensity exercise.  Spend less time sitting. Even light physical activity can be beneficial. Watch cholesterol and blood lipids Have your blood tested for lipids and cholesterol at 63 years of age, then have this test every 5 years. You may need to have your cholesterol levels checked more often if:  Your lipid or cholesterol levels are high.  You are older than 62 years of age.  You are at high risk for heart disease. What should I know about cancer screening? Many types of cancers can be detected early and may often be prevented. Depending on your health history and family history, you may need to have cancer screening at various  ages. This may include screening for:  Colorectal cancer.  Prostate cancer.  Skin cancer.  Lung cancer. What should I know about heart disease, diabetes, and high blood pressure? Blood pressure and heart disease  High blood pressure causes heart disease and increases the risk of stroke. This is more likely to develop in people who have high blood pressure readings, are of African descent, or are overweight.  Talk with your health care provider about your target blood pressure readings.  Have your blood pressure checked: ? Every 3-5 years if you are 34-65 years of age. ? Every year if you are 36 years old or older.  If you are between the ages of 33 and 68 and are a current or former smoker, ask your health care provider if you should have a one-time screening for abdominal aortic aneurysm (AAA). Diabetes Have regular diabetes screenings. This checks your fasting blood sugar level. Have the screening done:  Once every three years after age 39 if you are at a normal weight and have a low risk for diabetes.  More often and at a younger age if you are overweight or have a high risk for diabetes. What should I know about preventing infection? Hepatitis B If you have a higher risk for hepatitis B, you should be screened for this virus. Talk with your health care provider to find out if you are at risk for hepatitis B infection. Hepatitis C Blood testing is recommended for:  Everyone born from [birth year] through 1964.  Anyone with  known risk factors for hepatitis C. Sexually transmitted infections (STIs)  You should be screened each year for STIs, including gonorrhea and chlamydia, if: ? You are sexually active and are younger than 63 years of age. ? You are older than 63 years of age and your health care provider tells you that you are at risk for this type of infection. ? Your sexual activity has changed since you were last screened, and you are at increased risk for chlamydia or  gonorrhea. Ask your health care provider if you are at risk.  Ask your health care provider about whether you are at high risk for HIV. Your health care provider may recommend a prescription medicine to help prevent HIV infection. If you choose to take medicine to prevent HIV, you should first get tested for HIV. You should then be tested every 3 months for as long as you are taking the medicine. Follow these instructions at home: Lifestyle  Do not use any products that contain nicotine or tobacco, such as cigarettes, e-cigarettes, and chewing tobacco. If you need help quitting, ask your health care provider.  Do not use street drugs.  Do not share needles.  Ask your health care provider for help if you need support or information about quitting drugs. Alcohol use  Do not drink alcohol if your health care provider tells you not to drink.  If you drink alcohol: ? Limit how much you have to 0-2 drinks a day. ? Be aware of how much alcohol is in your drink. In the [place] one drink equals one 12 oz bottle of beer (355 mL), one 5 oz glass of wine (148 mL), or one 1 oz glass of hard liquor (44 mL). General instructions  Schedule regular health, dental, and eye exams.  Stay current with your vaccines.  Tell your health care provider if: ? You often feel depressed. ? You have ever been abused or do not feel safe at home. Summary  Adopting a healthy lifestyle and getting preventive care are important in promoting health and wellness.  Follow your health care provider's instructions about healthy diet, exercising, and getting tested or screened for diseases.  Follow your health care provider's instructions on monitoring your cholesterol and blood pressure. This information is not intended to replace advice given to you by your health care provider. Make sure you discuss any questions you have with your health care provider. Document Revised: 08/28/2018 Document Reviewed:  08/28/2018 Elsevier Patient Education  2020 [employer].

## 2020-03-25 NOTE — Assessment & Plan Note (Signed)
Checking lipid panel and he should be on statin. Last year did not respond to our request to start statin and he wishes to wait until labs back this time.

## 2020-03-25 NOTE — Assessment & Plan Note (Signed)
Flu shot yearly. Covid-19 complete. Pneumonia declines. Shingrix declines. Tetanus up to date. Colonoscopy up to date. Counseled about sun safety and mole surveillance. Counseled about the dangers of distracted driving. Given 10 year screening recommendations.

## 2020-03-25 NOTE — Assessment & Plan Note (Signed)
Taking metformin 1000 mg xr daily. Is on ARB but not statin which we have advised. Checking HgA1c and microalbumin to creatinine ratio. Reminded about eye exam yearly. Adjust as needed.

## 2020-03-25 NOTE — Assessment & Plan Note (Signed)
Weight is coming down slightly and advised him to work on diet and exercise during today's visit.

## 2020-03-25 NOTE — Assessment & Plan Note (Signed)
BP at goal on olmesartan/amlodipine/hctz and bystolic. Working on weight loss. Checking CMP and adjust as needed.

## 2020-03-29 ENCOUNTER — Encounter: Payer: Self-pay | Admitting: Internal Medicine

## 2020-04-06 ENCOUNTER — Other Ambulatory Visit: Payer: Self-pay | Admitting: Internal Medicine

## 2020-04-09 ENCOUNTER — Other Ambulatory Visit: Payer: Self-pay | Admitting: Internal Medicine

## 2020-04-09 DIAGNOSIS — I10 Essential (primary) hypertension: Secondary | ICD-10-CM

## 2020-04-10 ENCOUNTER — Other Ambulatory Visit: Payer: Self-pay | Admitting: Internal Medicine

## 2020-04-10 DIAGNOSIS — I10 Essential (primary) hypertension: Secondary | ICD-10-CM

## 2021-04-02 ENCOUNTER — Other Ambulatory Visit: Payer: Self-pay | Admitting: Internal Medicine

## 2021-04-02 DIAGNOSIS — I10 Essential (primary) hypertension: Secondary | ICD-10-CM

## 2021-04-09 ENCOUNTER — Other Ambulatory Visit: Payer: Self-pay | Admitting: Internal Medicine

## 2021-04-18 ENCOUNTER — Other Ambulatory Visit: Payer: Self-pay | Admitting: Internal Medicine

## 2021-10-24 LAB — EXTERNAL GENERIC LAB PROCEDURE: COLOGUARD: NEGATIVE

## 2023-08-03 NOTE — Telephone Encounter (Signed)
[facility] PCP removed from Care Team.
